# Patient Record
Sex: Female | Born: 1980 | Race: White | Hispanic: No | Marital: Married | Smoking: Former smoker
Health system: Southern US, Community
[De-identification: ages and names within clinical notes are randomized; demographics above are authoritative.]

## PROBLEM LIST (undated history)

## (undated) ENCOUNTER — Inpatient Hospital Stay (HOSPITAL_COMMUNITY): Payer: Self-pay

## (undated) DIAGNOSIS — I10 Essential (primary) hypertension: Secondary | ICD-10-CM

## (undated) DIAGNOSIS — B019 Varicella without complication: Secondary | ICD-10-CM

## (undated) DIAGNOSIS — N390 Urinary tract infection, site not specified: Secondary | ICD-10-CM

## (undated) DIAGNOSIS — F419 Anxiety disorder, unspecified: Secondary | ICD-10-CM

## (undated) HISTORY — DX: Varicella without complication: B01.9

## (undated) HISTORY — DX: Anxiety disorder, unspecified: F41.9

## (undated) HISTORY — DX: Urinary tract infection, site not specified: N39.0

## (undated) HISTORY — DX: Essential (primary) hypertension: I10

---

## 2012-01-10 ENCOUNTER — Encounter (HOSPITAL_COMMUNITY): Payer: Self-pay | Admitting: *Deleted

## 2012-01-10 ENCOUNTER — Emergency Department (HOSPITAL_COMMUNITY)
Admission: EM | Admit: 2012-01-10 | Discharge: 2012-01-10 | Disposition: A | Discharge status: Home / Self Care | Payer: Self-pay | Attending: Emergency Medicine | Admitting: Emergency Medicine

## 2012-01-10 DIAGNOSIS — M79609 Pain in unspecified limb: Secondary | ICD-10-CM | POA: Insufficient documentation

## 2012-01-10 DIAGNOSIS — S61409A Unspecified open wound of unspecified hand, initial encounter: Secondary | ICD-10-CM | POA: Insufficient documentation

## 2012-01-10 DIAGNOSIS — Y92009 Unspecified place in unspecified non-institutional (private) residence as the place of occurrence of the external cause: Secondary | ICD-10-CM | POA: Insufficient documentation

## 2012-01-10 DIAGNOSIS — IMO0002 Reserved for concepts with insufficient information to code with codable children: Secondary | ICD-10-CM

## 2012-01-10 DIAGNOSIS — W268XXA Contact with other sharp object(s), not elsewhere classified, initial encounter: Secondary | ICD-10-CM | POA: Insufficient documentation

## 2012-01-10 DIAGNOSIS — Y93E6 Activity, residential relocation: Secondary | ICD-10-CM | POA: Insufficient documentation

## 2012-01-10 MED ORDER — HYDROCODONE-ACETAMINOPHEN 5-325 MG PO TABS: 1.0000 | ORAL_TABLET | Freq: Four times a day (QID) | ORAL | Status: AC | PRN

## 2012-01-10 MED ORDER — TETANUS-DIPHTH-ACELL PERTUSSIS 5-2.5-18.5 LF-MCG/0.5 IM SUSP
0.5000 mL | Freq: Once | INTRAMUSCULAR | Status: AC
  Administered 2012-01-10: 0.5 mL via INTRAMUSCULAR
  Filled 2012-01-10: qty 0.5

## 2012-01-10 NOTE — ED Notes (Signed)
P.A. At bedside for suturing of laceration

## 2012-01-10 NOTE — ED Notes (Signed)
Pt denies any pain or questions upon discharge. Pt has no observed or reported reaction to tdap shot.

## 2012-01-10 NOTE — ED Provider Notes (Signed)
History     CSN: [id number]  Arrival date & time 01/10/12  2009   First MD Initiated Contact with Patient 01/10/12 2133      Chief Complaint  Patient presents with  . Laceration    (Consider location/radiation/quality/duration/timing/severity/associated sxs/prior treatment) HPI Comments: Patient presents to the ED with a right hand laceration.  Laceration occurred around 8 o'clock and 8 name.  Patient states that she was packing up her house to move and cut her hand on a vegetable chopper blade.  Patient denies lightheadedness or dizziness.  She has no other complaints.  Patient is a 31 y.o. female presenting with skin laceration. The history is provided by the patient.  Laceration  The incident occurred 1 to 2 hours ago. The laceration is located on the right hand. The laceration is 3 cm in size. The laceration mechanism was a a metal edge. The pain is at a severity of 3/10. The pain is mild. The pain has been constant since onset. She reports no foreign bodies present. Her tetanus status is out of date.    History reviewed. No pertinent past medical history.  History reviewed. No pertinent past surgical history.  History reviewed. No pertinent family history.  History  Substance Use Topics  . Smoking status: Former Games developer  . Smokeless tobacco: Not on file  . Alcohol Use: Yes    OB History    Grav Para Term Preterm Abortions TAB SAB Ect Mult Living                  Review of Systems  Constitutional: Negative for fever, chills and appetite change.  HENT: Negative for congestion.   Eyes: Negative for visual disturbance.  Respiratory: Negative for shortness of breath.   Cardiovascular: Negative for chest pain and leg swelling.  Gastrointestinal: Negative for abdominal pain.  Genitourinary: Negative for dysuria, urgency and frequency.  Neurological: Negative for dizziness, syncope, weakness, light-headedness, numbness and headaches.  Psychiatric/Behavioral: Negative for  confusion.  All other systems reviewed and are negative.    Allergies  Review of patient's allergies indicates no known allergies.  Home Medications   Current Outpatient Rx  Name Route Sig Dispense Refill  . IBUPROFEN 200 MG PO TABS Oral Take 400 mg by mouth every 6 (six) hours as needed. For pain      BP 115/79  Pulse 84  Temp(Src) 98 F (36.7 C) (Oral)  Resp 19  Ht 5\' 5"  (1.651 m)  Wt 140 lb (63.504 kg)  BMI 23.30 kg/m2  SpO2 98%  LMP 01/10/2012  Physical Exam  Nursing note and vitals reviewed. Constitutional: She is oriented to person, place, and time. She appears well-developed and well-nourished. No distress.  HENT:  Head: Normocephalic and atraumatic.  Eyes: Conjunctivae and EOM are normal.  Neck: Normal range of motion.  Pulmonary/Chest: Effort normal.  Musculoskeletal: Normal range of motion.  Neurological: She is alert and oriented to person, place, and time.  Skin: Skin is warm and dry. Laceration noted. No rash noted. She is not diaphoretic.       3 cm laceration located on  palmar surface of right hand below index finger.  Full range of motion of MCP PIP and DIPs.  2 point discrimination intact. No FB, warmth, or surrounding erythema.   Psychiatric: She has a normal mood and affect. Her behavior is normal.    ED Course  Procedures (including critical care time)  Labs Reviewed - No data to display No results found.  No diagnosis found.  LACERATION REPAIR Performed by: Jaci Carrel Authorized by: Jaci Carrel Consent: Verbal consent obtained. Risks and benefits: risks, benefits and alternatives were discussed Consent given by: patient Patient identity confirmed: provided demographic data Prepped and Draped in normal sterile fashion Wound explored  Laceration Location: right hand palmer surface below index finger  Laceration Length: 3cm  No Foreign Bodies seen or palpated  Anesthesia: local infiltration  Local anesthetic: lidocaine 1% with  out epinephrine  Anesthetic total: 1.5 ml  Irrigation method: syringe Amount of cleaning: standard  Skin closure: 4.0 prolene  Number of sutures: 6  Technique: simple interupted  Patient tolerance: Patient tolerated the procedure well with no immediate complications.   MDM  Laceration repair  Patient was given tetanus booster while in the emergency department.  She's been advised to followup for suture removal and wound recheck in 7 days.  I discussed with the patient home wound care treatment and I have told the patient I would provide this information in her discharge paperwork as well.    Medical screening examination/treatment/procedure(s) were performed by non-physician practitioner and as supervising physician I was immediately available for consultation/collaboration. Osvaldo Human, M.D.     Jaci Carrel, PA-C 01/10/12 2227  Carleene Cooper III, MD 01/11/12 [phone]

## 2012-01-10 NOTE — ED Notes (Signed)
Pt reports packing "the blade was sticking out of the blender and I was mashing hard and cut my hand". Pt has laceration to right hand, bleeding controlled at this time. Palpable radial pulse and pt can wiggle all digits. Pt has gauze over laceration with pressure added.

## 2012-01-10 NOTE — ED Notes (Signed)
The pt has hand laceration she did on a chopper when she pressed against it.  No active bleeding at present

## 2016-06-03 ENCOUNTER — Encounter (HOSPITAL_COMMUNITY): Payer: Self-pay | Admitting: *Deleted

## 2016-06-03 ENCOUNTER — Inpatient Hospital Stay (HOSPITAL_COMMUNITY)
Admission: AD | Admit: 2016-06-03 | Discharge: 2016-06-04 | Disposition: A | Discharge status: Other Acute Inpatient Hospital | Payer: Managed Care, Other (non HMO) | Attending: Emergency Medicine | Admitting: Emergency Medicine

## 2016-06-03 ENCOUNTER — Emergency Department (HOSPITAL_COMMUNITY): Payer: Managed Care, Other (non HMO)

## 2016-06-03 DIAGNOSIS — Z3A01 Less than 8 weeks gestation of pregnancy: Secondary | ICD-10-CM | POA: Insufficient documentation

## 2016-06-03 DIAGNOSIS — Z87891 Personal history of nicotine dependence: Secondary | ICD-10-CM | POA: Insufficient documentation

## 2016-06-03 DIAGNOSIS — O209 Hemorrhage in early pregnancy, unspecified: Secondary | ICD-10-CM | POA: Insufficient documentation

## 2016-06-03 DIAGNOSIS — R102 Pelvic and perineal pain: Secondary | ICD-10-CM

## 2016-06-03 DIAGNOSIS — R109 Unspecified abdominal pain: Secondary | ICD-10-CM | POA: Diagnosis not present

## 2016-06-03 DIAGNOSIS — O009 Unspecified ectopic pregnancy without intrauterine pregnancy: Secondary | ICD-10-CM

## 2016-06-03 DIAGNOSIS — O3680X0 Pregnancy with inconclusive fetal viability, not applicable or unspecified: Secondary | ICD-10-CM

## 2016-06-03 LAB — URINALYSIS, ROUTINE W REFLEX MICROSCOPIC
Bilirubin Urine: NEGATIVE
Glucose, UA: NEGATIVE mg/dL
Ketones, ur: 40 mg/dL — AB
LEUKOCYTES UA: NEGATIVE
Nitrite: NEGATIVE
PROTEIN: 30 mg/dL — AB
SPECIFIC GRAVITY, URINE: 1.025 (ref 1.005–1.030)
pH: 7 (ref 5.0–8.0)

## 2016-06-03 LAB — COMPREHENSIVE METABOLIC PANEL
ALK PHOS: 67 U/L (ref 38–126)
ALT: 12 U/L — ABNORMAL LOW (ref 14–54)
ANION GAP: 9 (ref 5–15)
AST: 16 U/L (ref 15–41)
Albumin: 4.4 g/dL (ref 3.5–5.0)
BILIRUBIN TOTAL: 0.6 mg/dL (ref 0.3–1.2)
BUN: 9 mg/dL (ref 6–20)
CALCIUM: 9 mg/dL (ref 8.9–10.3)
CO2: 23 mmol/L (ref 22–32)
Chloride: 104 mmol/L (ref 101–111)
Creatinine, Ser: 0.57 mg/dL (ref 0.44–1.00)
GLUCOSE: 100 mg/dL — AB (ref 65–99)
POTASSIUM: 3.7 mmol/L (ref 3.5–5.1)
Sodium: 136 mmol/L (ref 135–145)
Total Protein: 7.4 g/dL (ref 6.5–8.1)

## 2016-06-03 LAB — CBC
HCT: 37.1 % (ref 36.0–46.0)
HEMOGLOBIN: 13 g/dL (ref 12.0–15.0)
MCH: 31.7 pg (ref 26.0–34.0)
MCHC: 35 g/dL (ref 30.0–36.0)
MCV: 90.5 fL (ref 78.0–100.0)
Platelets: 256 10*3/uL (ref 150–400)
RBC: 4.1 MIL/uL (ref 3.87–5.11)
RDW: 13.4 % (ref 11.5–15.5)
WBC: 14 10*3/uL — AB (ref 4.0–10.5)

## 2016-06-03 LAB — ABO/RH: ABO/RH(D): A POS

## 2016-06-03 LAB — TYPE AND SCREEN
ABO/RH(D): A POS
ANTIBODY SCREEN: NEGATIVE

## 2016-06-03 LAB — I-STAT BETA HCG BLOOD, ED (MC, WL, AP ONLY): HCG, QUANTITATIVE: 380.7 m[IU]/mL — AB (ref <5)

## 2016-06-03 LAB — URINE MICROSCOPIC-ADD ON

## 2016-06-03 LAB — HCG, QUANTITATIVE, PREGNANCY: hCG, Beta Chain, Quant, S: 404 m[IU]/mL — ABNORMAL HIGH (ref <5)

## 2016-06-03 LAB — LIPASE, BLOOD: Lipase: 25 U/L (ref 11–51)

## 2016-06-03 MED ORDER — SODIUM CHLORIDE 0.9 % IV BOLUS (SEPSIS)
1000.0000 mL | Freq: Once | INTRAVENOUS | Status: AC
  Administered 2016-06-03: 1000 mL via INTRAVENOUS

## 2016-06-03 NOTE — Progress Notes (Signed)
Patient listed as having [employer] insurance without a pcp.  EDCM provided patient with a list of pcps who accept [employer] insurance within a 15 mile radius of patient's zip code.  Patient thankful for services.  No further EDCM needs at this time.

## 2016-06-03 NOTE — ED Notes (Signed)
US at bedside

## 2016-06-03 NOTE — ED Provider Notes (Signed)
CSN: [id number]     Arrival date & time 06/03/16  1706 History   First MD Initiated Contact with Patient 06/03/16 1929     Chief Complaint  Patient presents with  . Abdominal Pain     (Consider location/radiation/quality/duration/timing/severity/associated sxs/prior Treatment) Patient is a 35 y.o. female presenting with abdominal pain. The history is provided by the patient (Patient complains of right lower quadrant abdominal pain. She had some bleeding vaginally 4 days ago. Spotting for a month).  Abdominal Pain Pain location: Right lower quadrant. Pain quality: aching   Pain radiates to:  Does not radiate Pain severity:  Moderate Onset quality:  Sudden Timing:  Constant Progression:  Waxing and waning Chronicity:  New Context: not alcohol use   Associated symptoms: no chest pain, no cough, no diarrhea, no fatigue and no hematuria     History reviewed. No pertinent past medical history. History reviewed. No pertinent past surgical history. No family history on file. Social History  Substance Use Topics  . Smoking status: Former Games developer  . Smokeless tobacco: None  . Alcohol Use: Yes   OB History    No data available     Review of Systems  Constitutional: Negative for appetite change and fatigue.  HENT: Negative for congestion, ear discharge and sinus pressure.   Eyes: Negative for discharge.  Respiratory: Negative for cough.   Cardiovascular: Negative for chest pain.  Gastrointestinal: Positive for abdominal pain. Negative for diarrhea.  Genitourinary: Negative for frequency and hematuria.       Vaginal bleeding  Musculoskeletal: Negative for back pain.  Skin: Negative for rash.  Neurological: Negative for seizures and headaches.  Psychiatric/Behavioral: Negative for hallucinations.      Allergies  Review of patient's allergies indicates no known allergies.  Home Medications   Prior to Admission medications   Medication Sig Start Date End Date Taking?  Authorizing Provider  ibuprofen (ADVIL,MOTRIN) 200 MG tablet Take 400 mg by mouth every 6 (six) hours as needed. For pain    Historical Provider, MD   BP 132/84 mmHg  Pulse 88  Temp(Src) 98.5 F (36.9 C) (Oral)  Resp 20  SpO2 98%  LMP 06/03/2016 Physical Exam  Constitutional: She is oriented to person, place, and time. She appears well-developed.  HENT:  Head: Normocephalic.  Eyes: Conjunctivae and EOM are normal. No scleral icterus.  Neck: Neck supple. No thyromegaly present.  Cardiovascular: Normal rate and regular rhythm.  Exam reveals no gallop and no friction rub.   No murmur heard. Pulmonary/Chest: No stridor. She has no wheezes. She has no rales. She exhibits no tenderness.  Abdominal: She exhibits no distension. There is tenderness. There is no rebound.  Moderate right lower quadrant tenderness  Genitourinary:  Tender right adnexal area. Os closed small amount of blood in vault  Musculoskeletal: Normal range of motion. She exhibits no edema.  Lymphadenopathy:    She has no cervical adenopathy.  Neurological: She is oriented to person, place, and time. She exhibits normal muscle tone. Coordination normal.  Skin: No rash noted. No erythema.  Psychiatric: She has a normal mood and affect. Her behavior is normal.    ED Course  Procedures (including critical care time) Labs Review Labs Reviewed  COMPREHENSIVE METABOLIC PANEL - Abnormal; Notable for the following:    Glucose, Bld 100 (*)    ALT 12 (*)    All other components within normal limits  CBC - Abnormal; Notable for the following:    WBC 14.0 (*)  All other components within normal limits  URINALYSIS, ROUTINE W REFLEX MICROSCOPIC (NOT AT [facility]) - Abnormal; Notable for the following:    APPearance CLOUDY (*)    Hgb urine dipstick LARGE (*)    Ketones, ur 40 (*)    Protein, ur 30 (*)    All other components within normal limits  URINE MICROSCOPIC-ADD ON - Abnormal; Notable for the following:    Squamous  Epithelial / LPF 6-30 (*)    Bacteria, UA MANY (*)    All other components within normal limits  HCG, QUANTITATIVE, PREGNANCY - Abnormal; Notable for the following:    hCG, Beta Chain, Quant, S 404 (*)    All other components within normal limits  I-STAT BETA HCG BLOOD, ED ([facility], [facility], [facility] ONLY) - Abnormal; Notable for the following:    I-stat hCG, quantitative 380.7 (*)    All other components within normal limits  LIPASE, BLOOD  TYPE AND SCREEN  ABO/RH  GC/CHLAMYDIA PROBE AMP ([place]) NOT AT [facility]    Imaging Review US Ob Comp Less 14 Wks  06/03/2016  CLINICAL DATA:  Right lower quadrant pelvic pain. Vaginal bleeding. Gestational age of [redacted] weeks 6 days by last menstrual. Beta HCG of 404. EXAM: OBSTETRIC <14 WK [place] AND TRANSVAGINAL OB US TECHNIQUE: Both transabdominal and transvaginal ultrasound examinations were performed for complete evaluation of the gestation as well as the maternal uterus, adnexal regions, and pelvic cul-de-sac. Transvaginal technique was performed to assess early pregnancy. COMPARISON:  None. FINDINGS: Intrauterine gestational sac: Absent Yolk sac:  Absent Embryo:  Absent Endometrium is thickened at 16 mm. Maternal uterus/adnexae: The left ovary measures 2.6 x 2.0 x 2.7 cm. Normal in morphology. Soft tissue echogenicity in the right adnexa of 4.8 x 2.0 x 4.2 cm is favored to represent ovarian parenchyma. Within the right ovary is a 1.7 x 1.4 x 2.1 cm simple appearing cyst. No definite extra ovarian adnexal mass is seen. There is a moderate amount of complex flu identified within the cul-de-sac, including on image 47. Fluid is also identified adjacent the left ovary. IMPRESSION: Lack of intrauterine gestational sac. Endometrial thickening with a moderate amount of complex fluid in the cul-de-sac. Although findings could relate to early intrauterine pregnancy (given the low beta HCG level), complex fluid is suspicious and differential considerations include ectopic pregnancy or  missed abortion. Consider short term beta HCG follow-up and possibly ultrasound. Electronically Signed   By: Jeronimo Greaves M.D.   On: 06/03/2016 21:38   US Ob Transvaginal  06/03/2016  CLINICAL DATA:  Right lower quadrant pelvic pain. Vaginal bleeding. Gestational age of [redacted] weeks 6 days by last menstrual. Beta HCG of 404. EXAM: OBSTETRIC <14 WK [place] AND TRANSVAGINAL OB US TECHNIQUE: Both transabdominal and transvaginal ultrasound examinations were performed for complete evaluation of the gestation as well as the maternal uterus, adnexal regions, and pelvic cul-de-sac. Transvaginal technique was performed to assess early pregnancy. COMPARISON:  None. FINDINGS: Intrauterine gestational sac: Absent Yolk sac:  Absent Embryo:  Absent Endometrium is thickened at 16 mm. Maternal uterus/adnexae: The left ovary measures 2.6 x 2.0 x 2.7 cm. Normal in morphology. Soft tissue echogenicity in the right adnexa of 4.8 x 2.0 x 4.2 cm is favored to represent ovarian parenchyma. Within the right ovary is a 1.7 x 1.4 x 2.1 cm simple appearing cyst. No definite extra ovarian adnexal mass is seen. There is a moderate amount of complex flu identified within the cul-de-sac, including on image 47. Fluid is also  identified adjacent the left ovary. IMPRESSION: Lack of intrauterine gestational sac. Endometrial thickening with a moderate amount of complex fluid in the cul-de-sac. Although findings could relate to early intrauterine pregnancy (given the low beta HCG level), complex fluid is suspicious and differential considerations include ectopic pregnancy or missed abortion. Consider short term beta HCG follow-up and possibly ultrasound. Electronically Signed   By: Jeronimo GreavesKyle  Talbot M.D.   On: 06/03/2016 21:38   I have personally reviewed and evaluated these images and lab results as part of my medical decision-making.   EKG Interpretation None      MDM   Final diagnoses:  Abdominal pain in female    Ultrasound of pelvis cannot rule  out ectopic pregnancy. I spoke with the GYN doctor Dr. Debroah LoopArnold and he [facility]. Patient will be transferred over there now    Bethann BerkshireJoseph Jeyla Bulger, MD 06/03/16 2210

## 2016-06-03 NOTE — MAU Note (Signed)
[facility] Pt rates pain at 6/10.

## 2016-06-03 NOTE — MAU Provider Note (Signed)
History     CSN: [id number]  Arrival date & time 06/03/16  1706   First Provider Initiated Contact with Patient 06/03/16 2351      Chief Complaint  Patient presents with  . Abdominal Pain  RLQ pain sudden onset  HPI Patient is a 35 y.o. female G1P0, Patient's last menstrual period was 05/11/2016.  presenting with abdominal pain. The history is provided by the patient (Patient complains of right lower quadrant abdominal pain. She had some bleeding vaginally 4 days ago. Spotting since onset of LMP, heavier bleeding today. She was transferred from [facility] ED for evaluation after positive pregnancy test and [place] that did not show IUP Abdominal Pain Pain location: Right lower quadrant. Pain quality: aching  Pain radiates to: Does not radiate Pain severity: Moderate Onset quality: Sudden Timing: Constant Progression: Waxing and waning Chronicity: New Context: not alcohol use  Associated symptoms: no chest pain, no cough, no diarrhea, no fatigue and no hematuria    Past Medical History  Diagnosis Date  . Medical history non-contributory     Past Surgical History  Procedure Laterality Date  . No past surgeries      History reviewed. No pertinent family history.  Social History  Substance Use Topics  . Smoking status: Former Games developer  . Smokeless tobacco: None  . Alcohol Use: Yes     Comment: 1-2 glasses a wine a month    OB History    Gravida Para Term Preterm AB TAB SAB Ectopic Multiple Living   1               Review of Systems  Constitutional: Negative.   Gastrointestinal: Positive for abdominal pain (RLQ). Negative for nausea.  Genitourinary: Positive for vaginal bleeding and menstrual problem. Negative for frequency, vaginal discharge and difficulty urinating.    Allergies  Review of patient's allergies indicates no known allergies.  Home Medications  No current outpatient prescriptions on file.  BP 141/75 mmHg  Pulse 78  Temp(Src) 98.6 F (37 C)  (Oral)  Resp 18  SpO2 100%  LMP 05/11/2016  Physical Exam  Constitutional: She is oriented to person, place, and time. She appears well-developed. No distress.  Pulmonary/Chest: Effort normal. No respiratory distress.  Genitourinary: Uterus normal. Vaginal discharge (vaginal bleeding) found.  Very minimal tenderness no mass no guarding  Neurological: She is alert and oriented to person, place, and time.  Skin: Skin is warm and dry.  Psychiatric: She has a normal mood and affect. Her behavior is normal.    MAU Course  Procedures (including critical care time)  Labs Reviewed  COMPREHENSIVE METABOLIC PANEL - Abnormal; Notable for the following:    Glucose, Bld 100 (*)    ALT 12 (*)    All other components within normal limits  CBC - Abnormal; Notable for the following:    WBC 14.0 (*)    All other components within normal limits  URINALYSIS, ROUTINE W REFLEX MICROSCOPIC (NOT AT [facility]) - Abnormal; Notable for the following:    APPearance CLOUDY (*)    Hgb urine dipstick LARGE (*)    Ketones, ur 40 (*)    Protein, ur 30 (*)    All other components within normal limits  URINE MICROSCOPIC-ADD ON - Abnormal; Notable for the following:    Squamous Epithelial / LPF 6-30 (*)    Bacteria, UA MANY (*)    All other components within normal limits  HCG, QUANTITATIVE, PREGNANCY - Abnormal; Notable for the following:    hCG,  Beta Chain, Quant, S 404 (*)    All other components within normal limits  I-STAT BETA HCG BLOOD, ED ([facility], [facility], [facility] ONLY) - Abnormal; Notable for the following:    I-stat hCG, quantitative 380.7 (*)    All other components within normal limits  LIPASE, BLOOD  TYPE AND SCREEN  ABO/RH  GC/CHLAMYDIA PROBE AMP ([place]) NOT AT [facility]   US Ob Comp Less 14 Wks  06/03/2016  CLINICAL DATA:  Right lower quadrant pelvic pain. Vaginal bleeding. Gestational age of [redacted] weeks 6 days by last menstrual. Beta HCG of 404. EXAM: OBSTETRIC <14 WK [place] AND TRANSVAGINAL OB US TECHNIQUE: Both  transabdominal and transvaginal ultrasound examinations were performed for complete evaluation of the gestation as well as the maternal uterus, adnexal regions, and pelvic cul-de-sac. Transvaginal technique was performed to assess early pregnancy. COMPARISON:  None. FINDINGS: Intrauterine gestational sac: Absent Yolk sac:  Absent Embryo:  Absent Endometrium is thickened at 16 mm. Maternal uterus/adnexae: The left ovary measures 2.6 x 2.0 x 2.7 cm. Normal in morphology. Soft tissue echogenicity in the right adnexa of 4.8 x 2.0 x 4.2 cm is favored to represent ovarian parenchyma. Within the right ovary is a 1.7 x 1.4 x 2.1 cm simple appearing cyst. No definite extra ovarian adnexal mass is seen. There is a moderate amount of complex flu identified within the cul-de-sac, including on image 47. Fluid is also identified adjacent the left ovary. IMPRESSION: Lack of intrauterine gestational sac. Endometrial thickening with a moderate amount of complex fluid in the cul-de-sac. Although findings could relate to early intrauterine pregnancy (given the low beta HCG level), complex fluid is suspicious and differential considerations include ectopic pregnancy or missed abortion. Consider short term beta HCG follow-up and possibly ultrasound. Electronically Signed   By: Jeronimo Greaves M.D.   On: 06/03/2016 21:38   US Ob Transvaginal  06/03/2016  CLINICAL DATA:  Right lower quadrant pelvic pain. Vaginal bleeding. Gestational age of [redacted] weeks 6 days by last menstrual. Beta HCG of 404. EXAM: OBSTETRIC <14 WK [place] AND TRANSVAGINAL OB US TECHNIQUE: Both transabdominal and transvaginal ultrasound examinations were performed for complete evaluation of the gestation as well as the maternal uterus, adnexal regions, and pelvic cul-de-sac. Transvaginal technique was performed to assess early pregnancy. COMPARISON:  None. FINDINGS: Intrauterine gestational sac: Absent Yolk sac:  Absent Embryo:  Absent Endometrium is thickened at 16 mm. Maternal  uterus/adnexae: The left ovary measures 2.6 x 2.0 x 2.7 cm. Normal in morphology. Soft tissue echogenicity in the right adnexa of 4.8 x 2.0 x 4.2 cm is favored to represent ovarian parenchyma. Within the right ovary is a 1.7 x 1.4 x 2.1 cm simple appearing cyst. No definite extra ovarian adnexal mass is seen. There is a moderate amount of complex flu identified within the cul-de-sac, including on image 47. Fluid is also identified adjacent the left ovary. IMPRESSION: Lack of intrauterine gestational sac. Endometrial thickening with a moderate amount of complex fluid in the cul-de-sac. Although findings could relate to early intrauterine pregnancy (given the low beta HCG level), complex fluid is suspicious and differential considerations include ectopic pregnancy or missed abortion. Consider short term beta HCG follow-up and possibly ultrasound. Electronically Signed   By: Jeronimo Greaves M.D.   On: 06/03/2016 21:38         MDM  I reviewed the images and the [place] report, ED note and labs  Patient Active Problem List   Diagnosis Date Noted  . Pregnancy of unknown  anatomic location 06/03/2016   Possible ectopic pregnancy vs. Early pregnancy with ruptured cyst. Exam is benign with little tenderness no guarding. This is a desired pregnancy. I discussed the possibility of ectopic pregnancy and that surgical management is sometimes necessary. As she has a benign exam and is very stable with improvement in her pain she can have expectant management with close follow up. Return to MAU in 48 hours, 6/11/, for repeat HCG Ectopic precautions given  Adam PhenixJames G Kimla Furth, MD 06/04/2016

## 2016-06-03 NOTE — ED Notes (Addendum)
[facility] called to transport pt to MAU. [facility] advised that there would be a 2 hr wait. MD aware. Dr. Estell HarpinZammit requested to call EMS to transfer pt.

## 2016-06-03 NOTE — ED Notes (Signed)
Pt c/o lower right quadrant abdominal pain. States she had her period about 3 weeks ago with spotting and returned 4 days ago. Reports having big blood clots and extremely severe cramps, unlike her normal menses.

## 2016-06-04 MED ORDER — ACETAMINOPHEN 500 MG PO TABS
1000.0000 mg | ORAL_TABLET | Freq: Once | ORAL | Status: AC
  Administered 2016-06-04: 1000 mg via ORAL
  Filled 2016-06-04: qty 2

## 2016-06-04 NOTE — Discharge Instructions (Signed)

## 2016-06-05 ENCOUNTER — Inpatient Hospital Stay (HOSPITAL_COMMUNITY)
Admission: AD | Admit: 2016-06-05 | Discharge: 2016-06-05 | Disposition: A | Discharge status: Home / Self Care | Payer: Managed Care, Other (non HMO) | Source: Ambulatory Visit | Attending: Obstetrics and Gynecology | Admitting: Obstetrics and Gynecology

## 2016-06-05 ENCOUNTER — Inpatient Hospital Stay (HOSPITAL_COMMUNITY): Payer: Managed Care, Other (non HMO)

## 2016-06-05 DIAGNOSIS — O009 Unspecified ectopic pregnancy without intrauterine pregnancy: Secondary | ICD-10-CM | POA: Diagnosis not present

## 2016-06-05 DIAGNOSIS — O001 Tubal pregnancy without intrauterine pregnancy: Secondary | ICD-10-CM | POA: Diagnosis not present

## 2016-06-05 DIAGNOSIS — Z87891 Personal history of nicotine dependence: Secondary | ICD-10-CM | POA: Diagnosis not present

## 2016-06-05 DIAGNOSIS — R1031 Right lower quadrant pain: Secondary | ICD-10-CM | POA: Diagnosis present

## 2016-06-05 LAB — COMPREHENSIVE METABOLIC PANEL
ALT: 11 U/L — ABNORMAL LOW (ref 14–54)
AST: 16 U/L (ref 15–41)
Albumin: 4.1 g/dL (ref 3.5–5.0)
Alkaline Phosphatase: 71 U/L (ref 38–126)
Anion gap: 6 (ref 5–15)
BUN: 14 mg/dL (ref 6–20)
CO2: 27 mmol/L (ref 22–32)
Calcium: 9.2 mg/dL (ref 8.9–10.3)
Chloride: 103 mmol/L (ref 101–111)
Creatinine, Ser: 0.65 mg/dL (ref 0.44–1.00)
GFR calc Af Amer: >60 mL/min (ref >60)
GFR calc non Af Amer: >60 mL/min (ref >60)
Glucose, Bld: 98 mg/dL (ref 65–99)
Potassium: 4 mmol/L (ref 3.5–5.1)
Sodium: 136 mmol/L (ref 135–145)
Total Bilirubin: 0.5 mg/dL (ref 0.3–1.2)
Total Protein: 7.2 g/dL (ref 6.5–8.1)

## 2016-06-05 LAB — CBC
HCT: 39.9 % (ref 36.0–46.0)
Hemoglobin: 13.4 g/dL (ref 12.0–15.0)
MCH: 30.4 pg (ref 26.0–34.0)
MCHC: 33.6 g/dL (ref 30.0–36.0)
MCV: 90.5 fL (ref 78.0–100.0)
Platelets: 245 10*3/uL (ref 150–400)
RBC: 4.41 MIL/uL (ref 3.87–5.11)
RDW: 13.3 % (ref 11.5–15.5)
WBC: 7.4 10*3/uL (ref 4.0–10.5)

## 2016-06-05 LAB — HCG, QUANTITATIVE, PREGNANCY: HCG, BETA CHAIN, QUANT, S: 568 m[IU]/mL — AB (ref <5)

## 2016-06-05 MED ORDER — OXYCODONE-ACETAMINOPHEN 5-325 MG PO TABS: 1.0000 | ORAL_TABLET | Freq: Four times a day (QID) | ORAL | Status: DC | PRN

## 2016-06-05 MED ORDER — METHOTREXATE INJECTION FOR WOMEN'S HOSPITAL
50.0000 mg/m2 | Freq: Once | INTRAMUSCULAR | Status: AC
  Administered 2016-06-05: 90 mg via INTRAMUSCULAR
  Filled 2016-06-05: qty 1.8

## 2016-06-05 NOTE — MAU Note (Signed)
Patient here for repeat BHCG, having abdominal pain which has been since Friday, still having pain not as sharp 6-7/10, vaginal bleeding can use the same pad all day.

## 2016-06-05 NOTE — MAU Note (Signed)
Called lab to inquire about results, was told specimen was not received so had not been ran, explained to patient would be another hour before results received, verbalized an understanding and agreed to wait for results.

## 2016-06-05 NOTE — Discharge Instructions (Signed)
Ectopic Pregnancy °An ectopic pregnancy is when the fertilized egg attaches (implants) outside the uterus. Most ectopic pregnancies occur in the fallopian tube. Rarely do ectopic pregnancies occur on the ovary, intestine, pelvis, or cervix. In an ectopic pregnancy, the fertilized egg does not have the ability to develop into a normal, healthy baby.  °A ruptured ectopic pregnancy is one in which the fallopian tube gets torn or bursts and results in internal bleeding. Often there is intense abdominal pain, and sometimes, vaginal bleeding. Having an ectopic pregnancy can be life threatening. If left untreated, this dangerous condition can lead to a blood transfusion, abdominal surgery, or even death. °CAUSES  °Damage to the fallopian tubes is the suspected cause in most ectopic pregnancies.  °RISK FACTORS °Depending on your circumstances, the risk of having an ectopic pregnancy will vary. The level of risk can be divided into three categories. °High Risk °· You have gone through infertility treatment. °· You have had a previous ectopic pregnancy. °· You have had previous tubal surgery. °· You have had previous surgery to have the fallopian tubes tied (tubal ligation). °· You have tubal problems or diseases. °· You have been exposed to DES. DES is a medicine that was used until 1971 and had effects on babies whose mothers took the medicine. °· You become pregnant while using an intrauterine device (IUD) for birth control.  °Moderate Risk °· You have a history of infertility. °· You have a history of a sexually transmitted infection (STI). °· You have a history of pelvic inflammatory disease (PID). °· You have scarring from endometriosis. °· You have multiple sexual partners. °· You smoke.  °Low Risk °· You have had previous pelvic surgery. °· You use vaginal douching. °· You became sexually active before 34 years of age. °SIGNS AND SYMPTOMS  °An ectopic pregnancy should be suspected in anyone who has missed a period and  has abdominal pain or bleeding. °· You may experience normal pregnancy symptoms, such as: °· Nausea. °· Tiredness. °· Breast tenderness. °· Other symptoms may include: °· Pain with intercourse. °· Irregular vaginal bleeding or spotting. °· Cramping or pain on one side or in the lower abdomen. °· Fast heartbeat. °· Passing out while having a bowel movement. °· Symptoms of a ruptured ectopic pregnancy and internal bleeding may include: °· Sudden, severe pain in the abdomen and pelvis. °· Dizziness or fainting. °· Pain in the shoulder area. °DIAGNOSIS  °Tests that may be performed include: °· A pregnancy test. °· An ultrasound test. °· Testing the specific level of pregnancy hormone in the bloodstream. °· Taking a sample of uterus tissue (dilation and curettage, D&C). °· Surgery to perform a visual exam of the inside of the abdomen using a thin, lighted tube with a tiny camera on the end (laparoscope). °TREATMENT  °An injection of a medicine called methotrexate may be given. This medicine causes the pregnancy tissue to be absorbed. It is given if: °· The diagnosis is made early. °· The fallopian tube has not ruptured. °· You are considered to be a good candidate for the medicine. °Usually, pregnancy hormone blood levels are checked after methotrexate treatment. This is to be sure the medicine is effective. It may take 4-6 weeks for the pregnancy to be absorbed (though most pregnancies will be absorbed by 3 weeks). °Surgical treatment may be needed. A laparoscope may be used to remove the pregnancy tissue. If severe internal bleeding occurs, a cut (incision) may be made in the lower abdomen (laparotomy), and the ectopic   pregnancy is removed. This stops the bleeding. Part of the fallopian tube, or the whole tube, may be removed as well (salpingectomy). After surgery, pregnancy hormone tests may be done to be sure there is no pregnancy tissue left. You may receive a Rho (D) immune globulin shot if you are Rh negative and  the father is Rh positive, or if you do not know the Rh type of the father. This is to prevent problems with any future pregnancy. °SEEK IMMEDIATE MEDICAL CARE IF:  °You have any symptoms of an ectopic pregnancy. This is a medical emergency. °MAKE SURE YOU: °· Understand these instructions. °· Will watch your condition. °· Will get help right away if you are not doing well or get worse. °  °This information is not intended to replace advice given to you by your health care provider. Make sure you discuss any questions you have with your health care provider. °  °Document Released: 01/19/2005 Document Revised: 01/02/2015 Document Reviewed: 07/11/2013 °Elsevier Interactive Patient Education ©2016 Elsevier Inc. ° °Methotrexate Treatment for an Ectopic Pregnancy, Care After °Refer to this sheet in the next few weeks. These instructions provide you with information on caring for yourself after your procedure. Your health care provider may also give you more specific instructions. Your treatment has been planned according to current medical practices, but problems sometimes occur. Call your health care provider if you have any problems or questions after your procedure. °WHAT TO EXPECT AFTER THE PROCEDURE °You may have some abdominal cramping, vaginal bleeding, and fatigue in the first few days after taking methotrexate. Some other possible side effects of methotrexate include: °· Nausea. °· Vomiting. °· Diarrhea. °· Mouth sores. °· Swelling or irritation of the lining of your lungs (pneumonitis). °· Liver damage. °· Hair loss. °HOME CARE INSTRUCTIONS  °After you have received the methotrexate medicine, you need to be careful of your activities and watch your condition for several weeks. It may take 1 week before your hormone levels return to normal. °· Keep all follow-up appointments as directed by your health care provider. °· Avoid traveling too far away from your health care provider. °· Do not have sexual intercourse  until your health care provider says it is safe to do so. °· You may resume your usual diet. °· Limit strenuous activity. °· Do not take folic acid, prenatal vitamins, or other vitamins that contain folic acid. °· Do not take aspirin, ibuprofen, or naproxen (nonsteroidal anti-inflammatory drugs [NSAIDs]). °· Do not drink alcohol. °SEEK MEDICAL CARE IF:  °· You cannot control your nausea and vomiting. °· You cannot control your diarrhea. °· You have sores in your mouth and want treatment. °· You need pain medicine for your abdominal pain. °· You have a rash. °· You are having a reaction to the medicine. °SEEK IMMEDIATE MEDICAL CARE IF:  °· You have increasing abdominal or pelvic pain. °· You notice increased bleeding. °· You feel light-headed, or you faint. °· You have shortness of breath. °· Your heart rate increases. °· You have a cough. °· You have chills. °· You have a fever. °  °This information is not intended to replace advice given to you by your health care provider. Make sure you discuss any questions you have with your health care provider. °  °Document Released: 12/01/2011 Document Revised: 12/17/2013 Document Reviewed: 09/30/2013 °Elsevier Interactive Patient Education ©2016 Elsevier Inc. ° °

## 2016-06-05 NOTE — MAU Provider Note (Signed)
History     CSN: [id number]  Arrival date and time: 06/05/16 1622   None     Chief Complaint  Patient presents with  . Follow-up  . Abdominal Pain   HPI Janice Bradley is a 35 y.o. G1P0. She presents for repeat BHCG. LMP 5/17 making her 3 4/[redacted] wks EGA. She went to [facility] ED 6//9 with c/p RLQ pain and spotting x 23m bleed 4 d previous. Her BHCG was 404 U/S saw no IUGS, thick endometrium. There was soft tissue echogenicity on the right 4.8 x 2.0 x4.2 cm and 2 cm simple cyst. There was mod amt complex fluid in the cul de sac and left adnexa. Today she still has RLQ pain, thought it was getting better but increased past 1 hr. She rates it 7-8 on scale.   OB History    Gravida Para Term Preterm AB TAB SAB Ectopic Multiple Living   1               Past Medical History  Diagnosis Date  . Medical history non-contributory     Past Surgical History  Procedure Laterality Date  . No past surgeries      No family history on file.  Social History  Substance Use Topics  . Smoking status: Former SResearch scientist (life sciences) . Smokeless tobacco: Not on file  . Alcohol Use: Yes     Comment: 1-2 glasses a wine a month    Allergies: No Known Allergies  No prescriptions prior to admission    Review of Systems  Constitutional: Negative for diaphoresis.  Gastrointestinal: Positive for abdominal pain.  Neurological: Negative for dizziness and weakness.   Physical Exam   Blood pressure 133/66, pulse 89, temperature 98.1 F (36.7 C), resp. rate 18, height '5\' 6"'  (1.676 m), weight 160 lb (72.576 kg), last menstrual period 05/11/2016.  Physical Exam  Nursing note and vitals reviewed. Constitutional: She is oriented to person, place, and time. She appears well-developed and well-nourished.  GI: Soft. There is tenderness (RLQ tender). There is rebound. There is no guarding.  Musculoskeletal: Normal range of motion.  Neurological: She is alert and oriented to person, place, and time.  Skin: Skin is warm and  dry.  Psychiatric: She has a normal mood and affect. Her behavior is normal.    MAU Course  Procedures  MDM 8:15 pm. Radiology called with U/S results- probable right ectopic pregnancy. Consulted with Dr CElly Modena  Will give MTX, repeat BHCG Wed day 4 and Sat Day 7. Plan of care discussed with the pt. [place] Sunday [place] CBC and C Met pending  9:00 pm- CBC nl, C Met pending  Care to VMarlou Porch CNM  Results for orders placed or performed during the hospital encounter of 06/05/16 (from the past 24 hour(s))  hCG, quantitative, pregnancy     Status: Abnormal   Collection Time: 06/05/16  4:45 PM  Result Value Ref Range   hCG, Beta Chain, Quant, S 568 (H) <5 mIU/mL  CBC     Status: None   Collection Time: 06/05/16  8:39 PM  Result Value Ref Range   WBC 7.4 4.0 - 10.5 K/uL   RBC 4.41 3.87 - 5.11 MIL/uL   Hemoglobin 13.4 12.0 - 15.0 g/dL   HCT 39.9 36.0 - 46.0 %   MCV 90.5 78.0 - 100.0 fL   MCH 30.4 26.0 -  34.0 pg   MCHC 33.6 30.0 - 36.0 g/dL   RDW 13.3 11.5 - 15.5 %   Platelets 245 150 - 400 K/uL  Comprehensive metabolic panel     Status: Abnormal   Collection Time: 06/05/16  8:39 PM  Result Value Ref Range   Sodium 136 135 - 145 mmol/L   Potassium 4.0 3.5 - 5.1 mmol/L   Chloride 103 101 - 111 mmol/L   CO2 27 22 - 32 mmol/L   Glucose, Bld 98 65 - 99 mg/dL   BUN 14 6 - 20 mg/dL   Creatinine, Ser 0.65 0.44 - 1.00 mg/dL   Calcium 9.2 8.9 - 10.3 mg/dL   Total Protein 7.2 6.5 - 8.1 g/dL   Albumin 4.1 3.5 - 5.0 g/dL   AST 16 15 - 41 U/L   ALT 11 (L) 14 - 54 U/L   Alkaline Phosphatase 71 38 - 126 U/L   Total Bilirubin 0.5 0.3 - 1.2 mg/dL   GFR calc non Af Amer >60 >60 mL/min   GFR calc Af Amer >60 >60 mL/min   Anion gap 6 5 - 15   MTX given.   Assessment and Plan   1. Ectopic pregnancy    Discharge home in stable condition. Ectopic precautions. Support  given. Follow-up Information    Follow up with [facility] On 06/08/2016.   Specialty:  Obstetrics and Gynecology   Why:  at 11:00 for repeat blood work   Contact information:   [place] [phone]      Follow up with [facility].   Why:  As needed in emergencies   Contact information:   [address] [id number] [place] [phone]        Medication List    TAKE these medications        oxyCODONE-acetaminophen 5-325 MG tablet  Commonly known as:  PERCOCET/ROXICET  Take 1-2 tablets by mouth every 6 (six) hours as needed for severe pain.        Manya Silvas 06/05/2016, 10:28 PM

## 2016-06-08 ENCOUNTER — Telehealth: Payer: Self-pay | Admitting: General Practice

## 2016-06-08 ENCOUNTER — Encounter: Payer: Self-pay | Admitting: General Practice

## 2016-06-08 ENCOUNTER — Inpatient Hospital Stay (HOSPITAL_COMMUNITY)
Admission: AD | Admit: 2016-06-08 | Discharge: 2016-06-08 | Disposition: A | Discharge status: Home / Self Care | Payer: Managed Care, Other (non HMO) | Source: Ambulatory Visit | Attending: Obstetrics & Gynecology | Admitting: Obstetrics & Gynecology

## 2016-06-08 ENCOUNTER — Other Ambulatory Visit: Payer: Managed Care, Other (non HMO)

## 2016-06-08 DIAGNOSIS — O009 Unspecified ectopic pregnancy without intrauterine pregnancy: Secondary | ICD-10-CM

## 2016-06-08 DIAGNOSIS — O001 Tubal pregnancy without intrauterine pregnancy: Secondary | ICD-10-CM | POA: Diagnosis not present

## 2016-06-08 LAB — HCG, QUANTITATIVE, PREGNANCY: hCG, Beta Chain, Quant, S: 453 m[IU]/mL — ABNORMAL HIGH (ref <5)

## 2016-06-08 NOTE — Telephone Encounter (Signed)
Patient no showed for stat bhcg. Called patient, no answer- left message stating we are trying to reach you because you missed your schedule appt in our office this morning. Please call us back so we can reschedule this. You may come in tomorrow morning for this. Will send letter

## 2016-06-08 NOTE — MAU Note (Signed)
Pt here for repeat hcg. Had MTX on Sunday. States that vag bleeding is less. States that she is having some lower abdominal cramping-feels more than it was on Sunday. Rates 8/10.

## 2016-06-08 NOTE — Discharge Instructions (Signed)
Methotrexate Treatment for an Ectopic Pregnancy, Care After °Refer to this sheet in the next few weeks. These instructions provide you with information on caring for yourself after your procedure. Your health care provider may also give you more specific instructions. Your treatment has been planned according to current medical practices, but problems sometimes occur. Call your health care provider if you have any problems or questions after your procedure. °WHAT TO EXPECT AFTER THE PROCEDURE °You may have some abdominal cramping, vaginal bleeding, and fatigue in the first few days after taking methotrexate. Some other possible side effects of methotrexate include: °· Nausea. °· Vomiting. °· Diarrhea. °· Mouth sores. °· Swelling or irritation of the lining of your lungs (pneumonitis). °· Liver damage. °· Hair loss. °HOME CARE INSTRUCTIONS  °After you have received the methotrexate medicine, you need to be careful of your activities and watch your condition for several weeks. It may take 1 week before your hormone levels return to normal. °· Keep all follow-up appointments as directed by your health care provider. °· Avoid traveling too far away from your health care provider. °· Do not have sexual intercourse until your health care provider says it is safe to do so. °· You may resume your usual diet. °· Limit strenuous activity. °· Do not take folic acid, prenatal vitamins, or other vitamins that contain folic acid. °· Do not take aspirin, ibuprofen, or naproxen (nonsteroidal anti-inflammatory drugs [NSAIDs]). °· Do not drink alcohol. °SEEK MEDICAL CARE IF:  °· You cannot control your nausea and vomiting. °· You cannot control your diarrhea. °· You have sores in your mouth and want treatment. °· You need pain medicine for your abdominal pain. °· You have a rash. °· You are having a reaction to the medicine. °SEEK IMMEDIATE MEDICAL CARE IF:  °· You have increasing abdominal or pelvic pain. °· You notice increased  bleeding. °· You feel light-headed, or you faint. °· You have shortness of breath. °· Your heart rate increases. °· You have a cough. °· You have chills. °· You have a fever. °  °This information is not intended to replace advice given to you by your health care provider. Make sure you discuss any questions you have with your health care provider. °  °Document Released: 12/01/2011 Document Revised: 12/17/2013 Document Reviewed: 09/30/2013 °Elsevier Interactive Patient Education ©2016 Elsevier Inc. ° °

## 2016-06-08 NOTE — MAU Provider Note (Signed)
[name]  is a 35 y.o. G1P0 at Unknown who presents to MAU today for follow-up quant hCG on day #4 S/P MTX for ectopic pregnancy. [facility] but missed her appointment. She states decreased bleeding, only scant today. She had noted an increase in lower abdominal pain this morning, but that has improved. She rates pain now at 6/10. She has taken Tylenol and Percocet for pain.   BP 113/70 mmHg  Pulse 93  Temp(Src) 98.3 F (36.8 C) (Oral)  Resp 16  SpO2 100%  LMP 05/11/2016  CONSTITUTIONAL: Well-developed, well-nourished female in no acute distress.  ENT: External right and left ear normal.  EYES: EOM intact, conjunctivae normal.  MUSCULOSKELETAL: Normal range of motion.  CARDIOVASCULAR: Regular heart rate RESPIRATORY: Normal effort NEUROLOGICAL: Alert and oriented to person, place, and time.  ABDOMEN: soft, non-tender SKIN: Skin is warm and dry. No rash noted. Not diaphoretic. No erythema. No pallor. PSYCH: Normal mood and affect. Normal behavior. Normal judgment and thought content.  Results for Janice Bradley, Janice Bradley (MRN [id number]) as of 06/08/2016 20:42  Ref. Range 06/05/2016 16:45 06/05/2016 20:03 06/05/2016 20:39 06/08/2016 19:34  HCG, Beta Chain, Quant, S Latest Ref Range: <5 mIU/mL 568 (H)   453 (H)   MDM Discussed patient with Dr. Despina HiddenEure due to increased pain today, since hCG has shown decline and pain is managed now patient may be discharged to follow-up on Saturday Patient states that she is scheduled to be out of town this weekend for [place] brother[place]. I have advised that if she absolutely cannot be seen on Saturday here she could come Friday night prior to leaving town rather than not at all.   A: Ectopic pregnancy s/p MTX Decline in quant hCG on day #4  P: Discharge home Ectopic precautions discussed Patient advised to follow-up in MAU on Saturday for day #7 labs or sooner if her condition were to change or worsen    Marny LowensteinJulie N Jaquaya Coyle, PA-C 06/08/2016 8:30 PM

## 2016-06-10 ENCOUNTER — Telehealth: Payer: Self-pay | Admitting: Advanced Practice Midwife

## 2016-06-10 ENCOUNTER — Inpatient Hospital Stay (HOSPITAL_COMMUNITY)
Admission: AD | Admit: 2016-06-10 | Discharge: 2016-06-10 | Discharge status: Against Medical Advice | Payer: Managed Care, Other (non HMO) | Source: Ambulatory Visit | Attending: Obstetrics & Gynecology | Admitting: Obstetrics & Gynecology

## 2016-06-10 DIAGNOSIS — O00109 Unspecified tubal pregnancy without intrauterine pregnancy: Secondary | ICD-10-CM

## 2016-06-10 DIAGNOSIS — O001 Tubal pregnancy without intrauterine pregnancy: Secondary | ICD-10-CM | POA: Diagnosis present

## 2016-06-10 LAB — HCG, QUANTITATIVE, PREGNANCY: hCG, Beta Chain, Quant, S: 297 m[IU]/mL — ABNORMAL HIGH (ref <5)

## 2016-06-10 NOTE — Telephone Encounter (Signed)
Pt called requesting hcg results after leaving AMA after Day 7 MTX labs drawn.  Notified pt of appropriate drop in hcg.  Ectopic precautions reviewed.  Message sent to clinic f/u appointment/hcg in 1 week.

## 2016-06-10 NOTE — MAU Note (Signed)
Pt states she has to leave immediately after having her blood drawn, [place] is [place] to [place] for [place] brother[place] wedding tomorrow.  Informed pt that NP will need to speak with her after her lab draw.  Pt left after having blood drawn, did not wait for nursing staff or NP to speak with her before leaving.

## 2016-06-10 NOTE — MAU Note (Signed)
Pt here for BHCG post MTX, pt unable to come tomorrow due to brother's wedding.  Has occasional mild lower abd pain, has spotting.

## 2016-06-10 NOTE — MAU Provider Note (Signed)
Candyce ChurnChristine Barman is a 35 y.o. Unknown G1P0 who presents for day 7 labs s/p MTX for ectopic pregnancy.  Patient left AMA immediately after lab draw so I was not able to assess patient. Per RN, pt denied abdominal pain or vaginal bleeding.   Pt received MTX 6/11. She is here a day early b/c she is going out of town tonight for a family event.   BP 119/68 mmHg  Pulse 76  Temp(Src) 98 F (36.7 C) (Oral)  Resp 16  LMP 05/11/2016   Component     Latest Ref Rng 06/05/2016 06/08/2016 06/10/2016  HCG, Beta Chain, Quant, S     <5 mIU/mL 568 (H) 453 (H) 297 (H)   Patient called to inquire about her results after leaving AMA. Informed patient that BHCG had dropped & that she would need to f/u weekly for BHCG in [facility]. Agreeable to plan. Stressed importance of follow up. Also stressed importance of seeking immediate medical treatment for s/s ruptured ectopic.   Judeth HornErin Doreena Maulden, NP

## 2016-06-28 ENCOUNTER — Inpatient Hospital Stay (HOSPITAL_COMMUNITY)
Admission: AD | Admit: 2016-06-28 | Discharge: 2016-06-28 | Disposition: A | Discharge status: Home / Self Care | Payer: Managed Care, Other (non HMO) | Source: Ambulatory Visit | Attending: Obstetrics & Gynecology | Admitting: Obstetrics & Gynecology

## 2016-06-28 DIAGNOSIS — O009 Unspecified ectopic pregnancy without intrauterine pregnancy: Secondary | ICD-10-CM | POA: Insufficient documentation

## 2016-06-28 DIAGNOSIS — Z87891 Personal history of nicotine dependence: Secondary | ICD-10-CM | POA: Insufficient documentation

## 2016-06-28 DIAGNOSIS — Z09 Encounter for follow-up examination after completed treatment for conditions other than malignant neoplasm: Secondary | ICD-10-CM

## 2016-06-28 LAB — HCG, QUANTITATIVE, PREGNANCY: HCG, BETA CHAIN, QUANT, S: 1 m[IU]/mL (ref <5)

## 2016-06-28 NOTE — Discharge Instructions (Signed)

## 2016-06-28 NOTE — MAU Note (Signed)
Pt presents to MAU for follow up after methotrexate injection given for ectopic pregnancy. Injection was given on June the 11th. Pt has had follow up once  after the injection but had a trip out of the country and just returned yesterday.

## 2016-06-28 NOTE — MAU Provider Note (Signed)
  History     CSN: [id number]  Arrival date and time: 06/28/16 1454   None     No chief complaint on file.  HPI Janice ChurnChristine Bradley is 35 y.o. G1P0 presents for repeat BHCG after dx of right ectopic pregnancy and treatment with MTX on 6/11.  Was last seen on 6/16 for Day 7 f/u after  MTX- BHCG had dropped from 586 on 6/11; 453 on 6/14 to 297 on 6/16.  Instructed to have labs drawn weekly.  She has been out of the country; missed last week's f/u labs.  She denies vaginal bleeding and abdominal pain.   Past Medical History  Diagnosis Date  . Medical history non-contributory     Past Surgical History  Procedure Laterality Date  . No past surgeries      No family history on file.  Social History  Substance Use Topics  . Smoking status: Former Games developermoker  . Smokeless tobacco: Not on file  . Alcohol Use: Yes     Comment: 1-2 glasses a wine a month    Allergies: No Known Allergies  Prescriptions prior to admission  Medication Sig Dispense Refill Last Dose  . oxyCODONE-acetaminophen (PERCOCET/ROXICET) 5-325 MG tablet Take 1-2 tablets by mouth every 6 (six) hours as needed for severe pain. 15 tablet 0     Review of Systems  Constitutional: Negative for fever and chills.  Gastrointestinal: Negative for abdominal pain.  Genitourinary:       Neg for vaginal bleeding.   Physical Exam   Last menstrual period 05/11/2016.  Physical Exam  Constitutional: She is oriented to person, place, and time. She appears well-developed and well-nourished. No distress.  Genitourinary:  Not indicated for today's exam.  Neurological: She is alert and oriented to person, place, and time.  Psychiatric: She has a normal mood and affect. Her behavior is normal. Thought content normal.   Results for orders placed or performed during the hospital encounter of 06/28/16 (from the past 24 hour(s))  hCG, quantitative, pregnancy     Status: None   Collection Time: 06/28/16  3:02 PM  Result Value Ref Range   hCG, Beta Chain, Quant, S 1 <5 mIU/mL   MAU Course  Procedures  MDM MSE  Assessment and Plan  A:  Follow up for right ectopic pregnancy treated with MTX      Pregnancy resolved.  BHCG  1  P:  Results given to patient       Patient has list of OB GYNs and plans to make appt.    Janice Bradley,[name] 06/28/2016, 2:57 PM

## 2017-04-08 ENCOUNTER — Encounter (HOSPITAL_COMMUNITY): Payer: Self-pay

## 2017-04-21 ENCOUNTER — Inpatient Hospital Stay (HOSPITAL_COMMUNITY): Payer: Managed Care, Other (non HMO)

## 2017-04-21 ENCOUNTER — Encounter (HOSPITAL_COMMUNITY): Payer: Self-pay | Admitting: *Deleted

## 2017-04-21 ENCOUNTER — Inpatient Hospital Stay (HOSPITAL_COMMUNITY)
Admission: AD | Admit: 2017-04-21 | Discharge: 2017-04-22 | Disposition: A | Discharge status: Home / Self Care | Payer: Managed Care, Other (non HMO) | Source: Ambulatory Visit | Attending: Obstetrics & Gynecology | Admitting: Obstetrics & Gynecology

## 2017-04-21 DIAGNOSIS — Z87891 Personal history of nicotine dependence: Secondary | ICD-10-CM | POA: Insufficient documentation

## 2017-04-21 DIAGNOSIS — O009 Unspecified ectopic pregnancy without intrauterine pregnancy: Secondary | ICD-10-CM | POA: Insufficient documentation

## 2017-04-21 DIAGNOSIS — O209 Hemorrhage in early pregnancy, unspecified: Secondary | ICD-10-CM

## 2017-04-21 DIAGNOSIS — O00101 Right tubal pregnancy without intrauterine pregnancy: Secondary | ICD-10-CM

## 2017-04-21 DIAGNOSIS — Z3A01 Less than 8 weeks gestation of pregnancy: Secondary | ICD-10-CM | POA: Insufficient documentation

## 2017-04-21 DIAGNOSIS — R109 Unspecified abdominal pain: Secondary | ICD-10-CM | POA: Diagnosis present

## 2017-04-21 LAB — URINALYSIS, ROUTINE W REFLEX MICROSCOPIC
Bilirubin Urine: NEGATIVE
Glucose, UA: NEGATIVE mg/dL
Ketones, ur: 5 mg/dL — AB
Leukocytes, UA: NEGATIVE
Nitrite: NEGATIVE
PROTEIN: NEGATIVE mg/dL
SPECIFIC GRAVITY, URINE: 1.025 (ref 1.005–1.030)
pH: 6 (ref 5.0–8.0)

## 2017-04-21 LAB — CBC WITH DIFFERENTIAL/PLATELET
Basophils Absolute: 0 10*3/uL (ref 0.0–0.1)
Basophils Relative: 0 %
EOS ABS: 0 10*3/uL (ref 0.0–0.7)
Eosinophils Relative: 0 %
HCT: 38.9 % (ref 36.0–46.0)
Hemoglobin: 13.1 g/dL (ref 12.0–15.0)
LYMPHS ABS: 1.8 10*3/uL (ref 0.7–4.0)
Lymphocytes Relative: 19 %
MCH: 31 pg (ref 26.0–34.0)
MCHC: 33.7 g/dL (ref 30.0–36.0)
MCV: 92.2 fL (ref 78.0–100.0)
MONO ABS: 0.4 10*3/uL (ref 0.1–1.0)
MONOS PCT: 5 %
NEUTROS ABS: 7.4 10*3/uL (ref 1.7–7.7)
Neutrophils Relative %: 76 %
Platelets: 255 10*3/uL (ref 150–400)
RBC: 4.22 MIL/uL (ref 3.87–5.11)
RDW: 13.4 % (ref 11.5–15.5)
WBC: 9.8 10*3/uL (ref 4.0–10.5)

## 2017-04-21 LAB — CREATININE, SERUM
CREATININE: 0.61 mg/dL (ref 0.44–1.00)
GFR calc Af Amer: >60 mL/min (ref >60)
GFR calc non Af Amer: >60 mL/min (ref >60)

## 2017-04-21 LAB — HCG, QUANTITATIVE, PREGNANCY: HCG, BETA CHAIN, QUANT, S: 3285 m[IU]/mL — AB (ref <5)

## 2017-04-21 LAB — WET PREP, GENITAL
SPERM: NONE SEEN
Trich, Wet Prep: NONE SEEN
Yeast Wet Prep HPF POC: NONE SEEN

## 2017-04-21 LAB — AST: AST: 13 U/L — ABNORMAL LOW (ref 15–41)

## 2017-04-21 LAB — BUN: BUN: 11 mg/dL (ref 6–20)

## 2017-04-21 LAB — POCT PREGNANCY, URINE: PREG TEST UR: POSITIVE — AB

## 2017-04-21 MED ORDER — METHOTREXATE INJECTION FOR WOMEN'S HOSPITAL
50.0000 mg/m2 | Freq: Once | INTRAMUSCULAR | Status: AC
  Administered 2017-04-22: 85 mg via INTRAMUSCULAR
  Filled 2017-04-21: qty 1.7

## 2017-04-21 NOTE — MAU Note (Signed)
Missed my period a wk ago and took upt and was positive. Last June had ectopic Tonight was going to visit family in [place] and had sudden abd pain and a lot of bleeding. Passed something weird and was dizzy and just pulled over as I was driving. Now having mild cramping and spotting. Had MTX with ectopic in June and treated here

## 2017-04-21 NOTE — MAU Provider Note (Signed)
History     CSN: [id number]  Arrival date and time: 04/21/17 4098   First Provider Initiated Contact with Patient 04/21/17 2142      Chief Complaint  Patient presents with  . Abdominal Pain  . Vaginal Bleeding   HPI [name] is a 36 y.o. G2P0010 at [redacted]w[redacted]d who presents to MAU today with complaint of vaginal bleeding and lower abdominal pain. The patient was driving tonight when she had sudden onset of severe pain. She then had a very heavy bleed and passed something that resembled tissue. She states much less bleeding now, just slightly more than spotting. She rates her pain at 2/10 currently. She states subjective fever earlier, but none now. She has had nausea intermittently without vomiting, diarrhea or constipation. Last intercourse was Monday.    OB History    Gravida Para Term Preterm AB Living   2       1     SAB TAB Ectopic Multiple Live Births       1          Past Medical History:  Diagnosis Date  . Medical history non-contributory     Past Surgical History:  Procedure Laterality Date  . NO PAST SURGERIES      History reviewed. No pertinent family history.  Social History  Substance Use Topics  . Smoking status: Former Games developer  . Smokeless tobacco: Never Used  . Alcohol use Yes     Comment: 1-2 glasses a wine a month    Allergies: No Known Allergies  No prescriptions prior to admission.    Review of Systems  Constitutional: Negative for fever.  Gastrointestinal: Positive for abdominal pain and nausea. Negative for constipation, diarrhea and vomiting.  Genitourinary: Positive for vaginal bleeding. Negative for dysuria, frequency, urgency and vaginal discharge.   Physical Exam   Blood pressure 112/68, pulse 84, temperature 98 F (36.7 C), resp. rate 18, height  (1.676 m), weight 144 lb (65.3 kg), last menstrual period 03/10/2017, unknown if currently breastfeeding.  Physical Exam  Nursing note and vitals reviewed. Constitutional: She  is oriented to person, place, and time. She appears well-developed and well-nourished. No distress.  HENT:  Head: Normocephalic and atraumatic.  Cardiovascular: Normal rate.   Respiratory: Effort normal.  GI: Soft. She exhibits no distension and no mass. There is no tenderness. There is no rebound and no guarding.  Genitourinary: Uterus is enlarged (slightly). Uterus is not tender. Cervix exhibits no motion tenderness, no discharge and no friability. Right adnexum displays fullness. Right adnexum displays no mass and no tenderness. Left adnexum displays no mass and no tenderness. There is bleeding (small) in the vagina. No vaginal discharge found.  Neurological: She is alert and oriented to person, place, and time.  Skin: Skin is warm and dry. No erythema.  Psychiatric: She has a normal mood and affect.  Dilation: Closed Effacement (%): Thick Cervical Position: Posterior Exam by:: Vonzella Nipple, PA-C   Results for orders placed or performed during the hospital encounter of 04/21/17 (from the past 24 hour(s))  Urinalysis, Routine w reflex microscopic     Status: Abnormal   Collection Time: 04/21/17  8:35 PM  Result Value Ref Range   Color, Urine YELLOW YELLOW   APPearance CLEAR CLEAR   Specific Gravity, Urine 1.025 1.005 - 1.030   pH 6.0 5.0 - 8.0   Glucose, UA NEGATIVE NEGATIVE mg/dL   Hgb urine dipstick LARGE (A) NEGATIVE   Bilirubin Urine NEGATIVE NEGATIVE  Ketones, ur 5 (A) NEGATIVE mg/dL   Protein, ur NEGATIVE NEGATIVE mg/dL   Nitrite NEGATIVE NEGATIVE   Leukocytes, UA NEGATIVE NEGATIVE   RBC / HPF TOO NUMEROUS TO COUNT 0 - 5 RBC/hpf   WBC, UA 0-5 0 - 5 WBC/hpf   Bacteria, UA RARE (A) NONE SEEN   Squamous Epithelial / LPF 0-5 (A) NONE SEEN   Mucous PRESENT   Pregnancy, urine POC     Status: Abnormal   Collection Time: 04/21/17  9:07 PM  Result Value Ref Range   Preg Test, Ur POSITIVE (A) NEGATIVE  CBC with Differential/Platelet     Status: None   Collection Time: 04/21/17   9:33 PM  Result Value Ref Range   WBC 9.8 4.0 - 10.5 K/uL   RBC 4.22 3.87 - 5.11 MIL/uL   Hemoglobin 13.1 12.0 - 15.0 g/dL   HCT 16.1 09.6 - 04.5 %   MCV 92.2 78.0 - 100.0 fL   MCH 31.0 26.0 - 34.0 pg   MCHC 33.7 30.0 - 36.0 g/dL   RDW 40.9 81.1 - 91.4 %   Platelets 255 150 - 400 K/uL   Neutrophils Relative % 76 %   Neutro Abs 7.4 1.7 - 7.7 K/uL   Lymphocytes Relative 19 %   Lymphs Abs 1.8 0.7 - 4.0 K/uL   Monocytes Relative 5 %   Monocytes Absolute 0.4 0.1 - 1.0 K/uL   Eosinophils Relative 0 %   Eosinophils Absolute 0.0 0.0 - 0.7 K/uL   Basophils Relative 0 %   Basophils Absolute 0.0 0.0 - 0.1 K/uL  hCG, quantitative, pregnancy     Status: Abnormal   Collection Time: 04/21/17  9:33 PM  Result Value Ref Range   hCG, Beta Chain, Quant, S 3,285 (H) <5 mIU/mL  AST     Status: Abnormal   Collection Time: 04/21/17  9:33 PM  Result Value Ref Range   AST 13 (L) 15 - 41 U/L  BUN     Status: None   Collection Time: 04/21/17  9:33 PM  Result Value Ref Range   BUN 11 6 - 20 mg/dL  Creatinine, serum     Status: None   Collection Time: 04/21/17  9:33 PM  Result Value Ref Range   Creatinine, Ser 0.61 0.44 - 1.00 mg/dL   GFR calc non Af Amer >60 >60 mL/min   GFR calc Af Amer >60 >60 mL/min  Wet prep, genital     Status: Abnormal   Collection Time: 04/21/17 10:00 PM  Result Value Ref Range   Yeast Wet Prep HPF POC NONE SEEN NONE SEEN   Trich, Wet Prep NONE SEEN NONE SEEN   Clue Cells Wet Prep HPF POC PRESENT (A) NONE SEEN   WBC, Wet Prep HPF POC MODERATE (A) NONE SEEN   Sperm NONE SEEN    US Ob Comp Less 14 Wks  Result Date: 04/21/2017 CLINICAL DATA:  Bleeding with pelvic pain EXAM: OBSTETRIC <14 WK [place] AND TRANSVAGINAL OB US TECHNIQUE: Both transabdominal and transvaginal ultrasound examinations were performed for complete evaluation of the gestation as well as the maternal uterus, adnexal regions, and pelvic cul-de-sac. Transvaginal technique was performed to assess early  pregnancy. COMPARISON:  06/05/2016 FINDINGS: Intrauterine gestational sac: Not visualized Yolk sac:  Not visualized Embryo:  Not visualized Maternal uterus/adnexae: Left ovary is within normal limits and measures 1.8 x 2.6 x 1.4 cm. The right ovary measures 3.6 x 1.7 x 2 cm. Abutting the right ovary in the right  adnexa is a complex heterogeneous mass with increased peripheral echogenicity and central hypoechogenicity, this measures 2 x 1.7 x 1.3 cm. Increased peripheral flow around the echogenic mass. Trace amount of complex fluid in the cul-de-sac. IMPRESSION: 1. No intrauterine pregnancy visualized. Heterogenous echogenic mass in the right adnexum measuring 2 cm with peripheral echogenicity, constellation of findings is suspicious for an ectopic pregnancy. 2. Trace amount of complex fluid in the cul-de-sac. Critical Value/emergent results were called by telephone at the time of interpretation on 04/21/2017 at 10:47 pm to Dr. Vonzella Nipple , who verbally acknowledged these results. Electronically Signed   By: Jasmine Pang M.D.   On: 04/21/2017 22:47   US Ob Transvaginal  Result Date: 04/21/2017 CLINICAL DATA:  Bleeding with pelvic pain EXAM: OBSTETRIC <14 WK [place] AND TRANSVAGINAL OB US TECHNIQUE: Both transabdominal and transvaginal ultrasound examinations were performed for complete evaluation of the gestation as well as the maternal uterus, adnexal regions, and pelvic cul-de-sac. Transvaginal technique was performed to assess early pregnancy. COMPARISON:  06/05/2016 FINDINGS: Intrauterine gestational sac: Not visualized Yolk sac:  Not visualized Embryo:  Not visualized Maternal uterus/adnexae: Left ovary is within normal limits and measures 1.8 x 2.6 x 1.4 cm. The right ovary measures 3.6 x 1.7 x 2 cm. Abutting the right ovary in the right adnexa is a complex heterogeneous mass with increased peripheral echogenicity and central hypoechogenicity, this measures 2 x 1.7 x 1.3 cm. Increased peripheral flow around  the echogenic mass. Trace amount of complex fluid in the cul-de-sac. IMPRESSION: 1. No intrauterine pregnancy visualized. Heterogenous echogenic mass in the right adnexum measuring 2 cm with peripheral echogenicity, constellation of findings is suspicious for an ectopic pregnancy. 2. Trace amount of complex fluid in the cul-de-sac. Critical Value/emergent results were called by telephone at the time of interpretation on 04/21/2017 at 10:47 pm to Dr. Vonzella Nipple , who verbally acknowledged these results. Electronically Signed   By: Jasmine Pang M.D.   On: 04/21/2017 22:47    MAU Course  Procedures None  MDM +UPT UA, wet prep, GC/chlamydia, CBC, quant hCG, HIV, RPR and [place] today to rule out ectopic pregnancy Discussed patient with Dr. Despina Hidden. Agrees with plan for MTX today.  Discussed plan with patient and husband. Agree to have MTX today.   Assessment and Plan  A: Right ectopic pregnancy  P: Discharge home Bleeding/ectopic precautions discussed Patient advised to follow-up with [facility] on Tuesday for day #4 MTX labs Patient may return to MAU as needed or if her condition were to change or worsen  Marny Lowenstein, PA-C  04/22/2017, 2:05 AM

## 2017-04-22 ENCOUNTER — Telehealth (HOSPITAL_COMMUNITY): Payer: Self-pay | Admitting: *Deleted

## 2017-04-22 ENCOUNTER — Telehealth (HOSPITAL_COMMUNITY): Payer: Self-pay

## 2017-04-22 DIAGNOSIS — O00101 Right tubal pregnancy without intrauterine pregnancy: Secondary | ICD-10-CM

## 2017-04-22 LAB — HIV ANTIBODY (ROUTINE TESTING W REFLEX): HIV Screen 4th Generation wRfx: NONREACTIVE

## 2017-04-22 LAB — RPR: RPR Ser Ql: NONREACTIVE

## 2017-04-22 NOTE — Progress Notes (Signed)
Written and verbal d/c instructions given and understanding voiced. To return TUes at 1100 for repeat BHCG in clinic

## 2017-04-22 NOTE — Discharge Instructions (Signed)
Methotrexate Treatment for an Ectopic Pregnancy, Care After °Refer to this sheet in the next few weeks. These instructions provide you with information on caring for yourself after your procedure. Your health care provider may also give you more specific instructions. Your treatment has been planned according to current medical practices, but problems sometimes occur. Call your health care provider if you have any problems or questions after your procedure. °What can I expect after the procedure? °You may have some abdominal cramping, vaginal bleeding, and fatigue in the first few days after taking methotrexate. Some other possible side effects of methotrexate include: °· Nausea. °· Vomiting. °· Diarrhea. °· Mouth sores. °· Swelling or irritation of the lining of your lungs (pneumonitis). °· Liver damage. °· Hair loss. ° °Follow these instructions at home: °After you have received the methotrexate medicine, you need to be careful of your activities and watch your condition for several weeks. It may take 1 week before your hormone levels return to normal. °Activity °· Do not have sexual intercourse until your health care provider says it is safe to do so. °· You may resume your usual diet. °· Limit strenuous activity. °· Do not drink alcohol. °General instructions °· Do not take aspirin, ibuprofen, or naproxen (nonsteroidal anti-inflammatory drugs [NSAIDs]). °· Do not take folic acid, prenatal vitamins, or other vitamins that contain folic acid. °· Avoid traveling too far away from your health care provider. °· Keep all follow-up visits as told by your health care provider. This is important. °Contact a health care provider if: °· You cannot control your nausea and vomiting. °· You cannot control your diarrhea. °· You have sores in your mouth and want treatment. °· You need pain medicine for your abdominal pain. °· You have a rash. °· You are having a reaction to the medicine. °Get help right away if: °· You have  increasing abdominal or pelvic pain. °· You notice increased bleeding. °· You feel light-headed, or you faint. °· You have shortness of breath. °· Your heart rate increases. °· You have a cough. °· You have chills. °· You have a fever. °This information is not intended to replace advice given to you by your health care provider. Make sure you discuss any questions you have with your health care provider. °Document Released: 12/01/2011 Document Revised: 05/19/2016 Document Reviewed: 09/30/2013 °Elsevier Interactive Patient Education © 2017 [employer] °Ectopic Pregnancy °An ectopic pregnancy happens when a fertilized egg grows outside the uterus. A pregnancy cannot live outside of the uterus. This problem often happens in the fallopian tube. It is often caused by damage to the fallopian tube. °If this problem is found early, you may be treated with medicine. If your tube tears or bursts open (ruptures), you will bleed inside. This is an emergency. You will need surgery. Get help right away. °What are the signs or symptoms? °You may have normal pregnancy symptoms at first. These include: °· Missing your period. °· Feeling sick to your stomach (nauseous). °· Being tired. °· Having tender breasts. ° °Then, you may start to have symptoms that are not normal. These include: °· Pain with sex (intercourse). °· Bleeding from the vagina. This includes light bleeding (spotting). °· Belly (abdomen) or lower belly cramping or pain. This may be felt on one side. °· A fast heartbeat (pulse). °· Passing out (fainting) after going poop (bowel movement). ° °If your tube tears, you may have symptoms such as: °· Really bad pain in the belly or lower belly. This   happens suddenly. °· Dizziness. °· Passing out. °· Shoulder pain. ° °Get help right away if: °You have any of these symptoms. This is an emergency. °This information is not intended to replace advice given to you by your health care provider. Make sure you discuss any  questions you have with your health care provider. °Document Released: 03/10/2009 Document Revised: 05/19/2016 Document Reviewed: 07/24/2013 °Elsevier Interactive Patient Education © 2017 [employer] Inc. ° °

## 2017-04-22 NOTE — Telephone Encounter (Signed)
Triage RN called back to MAU. States husband refuses to come back and will just give his wife advil. MAU RN told Triage Nurse Line  To encourage patient to return to MAU due to increasing pain and related risks of ectopic. Also to remind that advil is contraindicated with methotrexate. Triage RN states she will advise husband of patient.

## 2017-04-24 ENCOUNTER — Other Ambulatory Visit: Payer: Managed Care, Other (non HMO)

## 2017-04-24 DIAGNOSIS — O3680X0 Pregnancy with inconclusive fetal viability, not applicable or unspecified: Secondary | ICD-10-CM

## 2017-04-24 LAB — GC/CHLAMYDIA PROBE AMP (~~LOC~~) NOT AT ARMC
Chlamydia: NEGATIVE
Neisseria Gonorrhea: NEGATIVE

## 2017-04-25 ENCOUNTER — Telehealth: Payer: Self-pay | Admitting: General Practice

## 2017-04-25 ENCOUNTER — Ambulatory Visit: Payer: Managed Care, Other (non HMO)

## 2017-04-25 ENCOUNTER — Telehealth: Payer: Self-pay | Admitting: *Deleted

## 2017-04-25 LAB — BETA HCG QUANT (REF LAB): hCG Quant: 3168 m[IU]/mL

## 2017-04-25 NOTE — Telephone Encounter (Signed)
Called patient regarding bhcg results. No answer- left message stating we are trying to reach you regarding urgent results, please call us back.

## 2017-04-25 NOTE — Telephone Encounter (Signed)
Spoke with Dr Alysia Penna regarding bhcg levels. Patient's levels haven't changed since receiving MTX and needs to go to MAU immediately. Called patient, no answer- left message stating we are calling with urgent results please call back as soon as possible. Called emergency contact- no answer. Left same message. Will try again shortly.

## 2017-04-26 NOTE — Telephone Encounter (Addendum)
Pt left message today @ 1223 stating that she is calling back for her test results.  Pt's history reviewed with Dr. Despina Hidden who is presently on call.  He advised for pt to have repeat BHCG on Francena Zender #7 after receiving MTX.  Plan  Of care will be determined based on that lab result. Pt was called and informed of results and plan of care.  She agreed to repeat lab tomorrow morning however is not sure what time she can come in due to her job. It was agreed that I will call her tomorrow to schedule appt for lab only visit.  Pt voiced understanding of all information and instructions given.   5/3  0850  Spoke w/pt and she stated that she is still having a fair amount of pain. She agrees to come in today for lab draw between 10-11.  Pt was advised that she will need to be here no later than 11.  She voiced understanding and agreed.

## 2017-04-27 ENCOUNTER — Telehealth: Payer: Self-pay | Admitting: General Practice

## 2017-04-27 ENCOUNTER — Encounter (HOSPITAL_COMMUNITY): Payer: Self-pay

## 2017-04-27 ENCOUNTER — Ambulatory Visit (HOSPITAL_COMMUNITY)
Admission: AD | Admit: 2017-04-27 | Discharge: 2017-04-28 | Disposition: A | Discharge status: Home / Self Care | Payer: Managed Care, Other (non HMO) | Source: Ambulatory Visit | Attending: Obstetrics and Gynecology | Admitting: Obstetrics and Gynecology

## 2017-04-27 ENCOUNTER — Inpatient Hospital Stay (HOSPITAL_COMMUNITY): Payer: Managed Care, Other (non HMO)

## 2017-04-27 ENCOUNTER — Ambulatory Visit: Payer: Managed Care, Other (non HMO) | Admitting: General Practice

## 2017-04-27 DIAGNOSIS — K661 Hemoperitoneum: Secondary | ICD-10-CM

## 2017-04-27 DIAGNOSIS — O00101 Right tubal pregnancy without intrauterine pregnancy: Secondary | ICD-10-CM | POA: Insufficient documentation

## 2017-04-27 DIAGNOSIS — Z3A01 Less than 8 weeks gestation of pregnancy: Secondary | ICD-10-CM | POA: Insufficient documentation

## 2017-04-27 DIAGNOSIS — Z87891 Personal history of nicotine dependence: Secondary | ICD-10-CM | POA: Diagnosis not present

## 2017-04-27 DIAGNOSIS — O26891 Other specified pregnancy related conditions, first trimester: Secondary | ICD-10-CM | POA: Diagnosis not present

## 2017-04-27 DIAGNOSIS — O26899 Other specified pregnancy related conditions, unspecified trimester: Secondary | ICD-10-CM

## 2017-04-27 DIAGNOSIS — O209 Hemorrhage in early pregnancy, unspecified: Secondary | ICD-10-CM

## 2017-04-27 DIAGNOSIS — R102 Pelvic and perineal pain: Secondary | ICD-10-CM | POA: Diagnosis not present

## 2017-04-27 DIAGNOSIS — O3680X0 Pregnancy with inconclusive fetal viability, not applicable or unspecified: Secondary | ICD-10-CM

## 2017-04-27 DIAGNOSIS — R109 Unspecified abdominal pain: Secondary | ICD-10-CM

## 2017-04-27 DIAGNOSIS — O00109 Unspecified tubal pregnancy without intrauterine pregnancy: Secondary | ICD-10-CM

## 2017-04-27 LAB — CBC
HCT: 38.8 % (ref 36.0–46.0)
HEMOGLOBIN: 13 g/dL (ref 12.0–15.0)
MCH: 30.7 pg (ref 26.0–34.0)
MCHC: 33.5 g/dL (ref 30.0–36.0)
MCV: 91.7 fL (ref 78.0–100.0)
PLATELETS: 296 10*3/uL (ref 150–400)
RBC: 4.23 MIL/uL (ref 3.87–5.11)
RDW: 13.2 % (ref 11.5–15.5)
WBC: 8.4 10*3/uL (ref 4.0–10.5)

## 2017-04-27 LAB — TYPE AND SCREEN
ABO/RH(D): A POS
Antibody Screen: NEGATIVE

## 2017-04-27 LAB — HCG, QUANTITATIVE, PREGNANCY
HCG, BETA CHAIN, QUANT, S: 3444 m[IU]/mL — AB (ref <5)
HCG, BETA CHAIN, QUANT, S: 3067 m[IU]/mL — AB (ref <5)

## 2017-04-27 LAB — ABO/RH: ABO/RH(D): A POS

## 2017-04-27 MED ORDER — LACTATED RINGERS IV SOLN
INTRAVENOUS | Status: DC
  Administered 2017-04-27 – 2017-04-28 (×2): via INTRAVENOUS

## 2017-04-27 MED ORDER — ONDANSETRON HCL 4 MG/2ML IJ SOLN
INTRAMUSCULAR | Status: AC
  Filled 2017-04-27: qty 2

## 2017-04-27 MED ORDER — FAMOTIDINE IN NACL 20-0.9 MG/50ML-% IV SOLN
20.0000 mg | Freq: Once | INTRAVENOUS | Status: AC
  Administered 2017-04-27: 20 mg via INTRAVENOUS
  Filled 2017-04-27: qty 50

## 2017-04-27 MED ORDER — PROPOFOL 10 MG/ML IV BOLUS
INTRAVENOUS | Status: AC
  Filled 2017-04-27: qty 20

## 2017-04-27 MED ORDER — FENTANYL CITRATE (PF) 250 MCG/5ML IJ SOLN
INTRAMUSCULAR | Status: AC
  Filled 2017-04-27: qty 5

## 2017-04-27 MED ORDER — SOD CITRATE-CITRIC ACID 500-334 MG/5ML PO SOLN
30.0000 mL | Freq: Once | ORAL | Status: AC
  Administered 2017-04-27: 30 mL via ORAL
  Filled 2017-04-27: qty 15

## 2017-04-27 MED ORDER — ROCURONIUM BROMIDE 100 MG/10ML IV SOLN
INTRAVENOUS | Status: AC
  Filled 2017-04-27: qty 1

## 2017-04-27 MED ORDER — MIDAZOLAM HCL 2 MG/2ML IJ SOLN
INTRAMUSCULAR | Status: AC
  Filled 2017-04-27: qty 2

## 2017-04-27 MED ORDER — LIDOCAINE HCL (CARDIAC) 20 MG/ML IV SOLN
INTRAVENOUS | Status: AC
  Filled 2017-04-27: qty 5

## 2017-04-27 NOTE — Telephone Encounter (Signed)
Called patient regarding bhcg results and urgent need to go to MAU immediately. Patient verbalized understanding and states she will go later on this evening. Told patient this cannot wait until then she needs to go immediately. Patient states she doesn't like how this whole process has occurred and wants to know if [facility] can [facility]. Patient states she was in a lot of pain the other night and no one gave her pain medication of any kind even tylenol and she was clearly hurting. Patient states this is an entirely different experience than the first time this happened last June. Patient states no one explained to her the process, just gave her a shot and sent her home. Apologized to patient for her experience and that we always strive for great care. Discussed with patient that she may transfer her care but there really isn't time for that given the urgency. Informed patient of all bhcg results and that the result from today is the one that determines if further steps are needed or not. Told patient that the #4 labs is to see how the medication is working so far but that action isn't taken on day 4 labs unless there is something concerning such as severe increased pain. Patient states no one even called her with results from the other day. Told patient I actually called her multiple times on Tuesday for results and left multiple messages as well as a message on her husband's phone. Patient states she was working so she couldn't get to her phone until later in the evening. Explained to patient that this cannot wait and she needs to go to MAU right now. Patient states she can't until later this afternoon. Told patient that this was serious and is very time sensitive. Told patient this cannot wait a few hours & the worse scenario of this, is that she could rupture at any time and die. Patient states she understands but has something she cannot get out of. Patient states she is almost in [place] now for a job  interview at 2pm. Patient states she will leave after that and go to MAU. Told patient I will call MAU and let them know she is coming. Patient verbalized understanding and had no questions

## 2017-04-27 NOTE — H&P (Signed)
HPI  36 yo MW G2P0ectopic 2 here for follow up after MTX on 04-21-17. She was seen on 04-21-17 with pain and vaginal bleeding and a right ectopic was seen on u/s. She has a h/o right ectopic, treated with MTX in the past. Her day #7 QBHCG did not drop 15% from day #4 level. Her u/s today shows blood in the pelvis. She does report level 6-7 pelvic pain.   Past Medical History:  Diagnosis Date  . Medical history non-contributory     Past Surgical History:  Procedure Laterality Date  . NO PAST SURGERIES      No family history on file.  Social History  Substance Use Topics  . Smoking status: Former Games developermoker  . Smokeless tobacco: Never Used  . Alcohol use Yes     Comment: 1-2 glasses a wine a month    Allergies: No Known Allergies  Prescriptions Prior to Admission  Medication Sig Dispense Refill Last Dose  . Prenatal Vit-Min-FA-Fish Oil ([facility] PRENATAL GUMMY) 0.4-113.5 MG CHEW Chew 2 tablets by mouth daily.   04/20/2017 at Unknown time    Review of Systems Physical Exam   Blood pressure 116/79, pulse 74, temperature 98.5 F (36.9 C), temperature source Oral, resp. rate 18, height 5\' 6"  (1.676 m), weight 65 kg (143 lb 4 oz), last menstrual period 03/10/2017, unknown if currently breastfeeding.  Physical Exam  Heart- rrr Lungs- CTAB Abd- benign  MAU Course  Procedures  U/S as above   Assessment and Plan  Right ectopic pregnancy, likely ruptured. Will plan for laparoscopic removal of her right tube.   She understands the risks of surgery, including, but not to infection, bleeding, DVTs, damage to bowel, bladder, ureters. She wishes to proceed.     Allie BossierMyra C Rolen Conger 04/27/2017, 9:11 PM

## 2017-04-27 NOTE — MAU Note (Signed)
PT  SAYS SHE WAS HERE Friday NIGHT - CONFIRMED   ECTOPIC -  RECEIVED  METHOTREXATE -      THEN  HAD FOLLOW-UP  IN CLINIC- ON Monday -  THE NUMBERS DID NOT DROP LIKE   PROVIDER WANTED.        SO THEN  WENT BACK TO CLINIC  THIS MORN-  HAD LABS AND  QUANT  WAS HIGHER -   SO THEN  THEY CALLED  HER-  AND  TOLD   HER TO COME HERE.         HER CRAMPS AND NOW CONSTANT -- 6-7   AND RIGHT LEG FEELS NUMB/ TINGLING.     BLEEDING  IS LESS NOW  THAN  Friday -    IN TRIAGE - PAD -PANTYLINER-  SMALL AMT.

## 2017-04-27 NOTE — MAU Provider Note (Signed)
  History     CSN: [id number]  Arrival date and time: 04/27/17 1920   None     Chief Complaint  Patient presents with  . Follow-up   HPI  36 yo MW G2P0ectopic 2 here for follow up after MTX on 04-21-17. She was seen on 04-21-17 with pain and vaginal bleeding and a right ectopic was seen on u/s. She has a h/o right ectopic, treated with MTX in the past. Her day #7 QBHCG did not drop 15% from day #4 level. Her u/s today shows blood in the pelvis. She does report level 6-7 pelvic pain.   Past Medical History:  Diagnosis Date  . Medical history non-contributory     Past Surgical History:  Procedure Laterality Date  . NO PAST SURGERIES      No family history on file.  Social History  Substance Use Topics  . Smoking status: Former Games developermoker  . Smokeless tobacco: Never Used  . Alcohol use Yes     Comment: 1-2 glasses a wine a month    Allergies: No Known Allergies  Prescriptions Prior to Admission  Medication Sig Dispense Refill Last Dose  . Prenatal Vit-Min-FA-Fish Oil ([facility] PRENATAL GUMMY) 0.4-113.5 MG CHEW Chew 2 tablets by mouth daily.   04/20/2017 at Unknown time    Review of Systems Physical Exam   Blood pressure 116/79, pulse 74, temperature 98.5 F (36.9 C), temperature source Oral, resp. rate 18, height 5\' 6"  (1.676 m), weight 65 kg (143 lb 4 oz), last menstrual period 03/10/2017, unknown if currently breastfeeding.  Physical Exam  Heart- rrr Lungs- CTAB Abd- benign  MAU Course  Procedures  U/S as above   Assessment and Plan  Right ectopic pregnancy, likely ruptured. Will plan for laparoscopic removal of her right tube.   She understands the risks of surgery, including, but not to infection, bleeding, DVTs, damage to bowel, bladder, ureters. She wishes to proceed.     Allie BossierMyra C Aven Cegielski 04/27/2017, 9:11 PM

## 2017-04-27 NOTE — Progress Notes (Signed)
Patient here for stat bhcg. State she is having pain this morning radiating down into her thigh and bleeding as well. Discussed her waiting for results today and not leaving. Patient verbalized understanding. Notified by front office patient left office at 1145 against recommendation. Spoke with Dr Shawnie PonsPratt regarding results who recommends patient go to MAU immediately. Will call patient

## 2017-04-28 ENCOUNTER — Inpatient Hospital Stay (HOSPITAL_COMMUNITY): Payer: Managed Care, Other (non HMO) | Admitting: Anesthesiology

## 2017-04-28 ENCOUNTER — Encounter (HOSPITAL_COMMUNITY): Payer: Self-pay | Admitting: Anesthesiology

## 2017-04-28 ENCOUNTER — Encounter (HOSPITAL_COMMUNITY)
Admission: AD | Disposition: A | Discharge status: Home / Self Care | Payer: Self-pay | Source: Ambulatory Visit | Attending: Obstetrics and Gynecology

## 2017-04-28 DIAGNOSIS — O00101 Right tubal pregnancy without intrauterine pregnancy: Secondary | ICD-10-CM | POA: Diagnosis not present

## 2017-04-28 HISTORY — PX: DIAGNOSTIC LAPAROSCOPY WITH REMOVAL OF ECTOPIC PREGNANCY: SHX6449

## 2017-04-28 SURGERY — LAPAROSCOPY, WITH ECTOPIC PREGNANCY SURGICAL TREATMENT
Anesthesia: General | Site: Abdomen | Laterality: Right

## 2017-04-28 MED ORDER — HYDROMORPHONE HCL 1 MG/ML IJ SOLN
INTRAMUSCULAR | Status: AC
  Filled 2017-04-28: qty 1

## 2017-04-28 MED ORDER — OXYCODONE HCL 5 MG PO TABS
5.0000 mg | ORAL_TABLET | Freq: Four times a day (QID) | ORAL | 0 refills | Status: DC | PRN
Start: 2017-04-28 — End: 2018-01-26

## 2017-04-28 MED ORDER — LIDOCAINE HCL (CARDIAC) 20 MG/ML IV SOLN
INTRAVENOUS | Status: DC | PRN
  Administered 2017-04-28: 100 mg via INTRAVENOUS

## 2017-04-28 MED ORDER — KETOROLAC TROMETHAMINE 30 MG/ML IJ SOLN: 30.0000 mg | Freq: Once | INTRAMUSCULAR | Status: DC | PRN

## 2017-04-28 MED ORDER — IBUPROFEN 800 MG PO TABS: 800.0000 mg | ORAL_TABLET | Freq: Three times a day (TID) | ORAL | 1 refills | Status: DC | PRN

## 2017-04-28 MED ORDER — SUCCINYLCHOLINE CHLORIDE 20 MG/ML IJ SOLN
INTRAMUSCULAR | Status: DC | PRN
  Administered 2017-04-28: 120 mg via INTRAVENOUS

## 2017-04-28 MED ORDER — LACTATED RINGERS IR SOLN
Status: DC | PRN
  Administered 2017-04-28: 3000 mL

## 2017-04-28 MED ORDER — ONDANSETRON HCL 4 MG/2ML IJ SOLN: 4.0000 mg | Freq: Once | INTRAMUSCULAR | Status: DC | PRN

## 2017-04-28 MED ORDER — PROPOFOL 10 MG/ML IV BOLUS
INTRAVENOUS | Status: DC | PRN
  Administered 2017-04-28: 200 mg via INTRAVENOUS

## 2017-04-28 MED ORDER — ONDANSETRON HCL 4 MG/2ML IJ SOLN
INTRAMUSCULAR | Status: AC
  Filled 2017-04-28: qty 2

## 2017-04-28 MED ORDER — SUGAMMADEX SODIUM 200 MG/2ML IV SOLN
INTRAVENOUS | Status: DC | PRN
  Administered 2017-04-28: 130 mg via INTRAVENOUS

## 2017-04-28 MED ORDER — FENTANYL CITRATE (PF) 100 MCG/2ML IJ SOLN
INTRAMUSCULAR | Status: AC
  Filled 2017-04-28: qty 2

## 2017-04-28 MED ORDER — MIDAZOLAM HCL 5 MG/5ML IJ SOLN
INTRAMUSCULAR | Status: DC | PRN
  Administered 2017-04-28: 2 mg via INTRAVENOUS

## 2017-04-28 MED ORDER — ROCURONIUM BROMIDE 100 MG/10ML IV SOLN
INTRAVENOUS | Status: DC | PRN
  Administered 2017-04-28: 30 mg via INTRAVENOUS
  Administered 2017-04-28: 10 mg via INTRAVENOUS

## 2017-04-28 MED ORDER — MEPERIDINE HCL 25 MG/ML IJ SOLN: 6.2500 mg | INTRAMUSCULAR | Status: DC | PRN

## 2017-04-28 MED ORDER — SCOPOLAMINE 1 MG/3DAYS TD PT72
MEDICATED_PATCH | TRANSDERMAL | Status: AC
  Filled 2017-04-28: qty 1

## 2017-04-28 MED ORDER — ONDANSETRON HCL 4 MG/2ML IJ SOLN
INTRAMUSCULAR | Status: DC | PRN
  Administered 2017-04-28: 4 mg via INTRAVENOUS

## 2017-04-28 MED ORDER — SCOPOLAMINE 1 MG/3DAYS TD PT72
MEDICATED_PATCH | TRANSDERMAL | Status: DC | PRN
  Administered 2017-04-28: 1 via TRANSDERMAL

## 2017-04-28 MED ORDER — FENTANYL CITRATE (PF) 100 MCG/2ML IJ SOLN
INTRAMUSCULAR | Status: DC | PRN
  Administered 2017-04-28: 100 ug via INTRAVENOUS
  Administered 2017-04-28: 150 ug via INTRAVENOUS

## 2017-04-28 MED ORDER — BUPIVACAINE HCL (PF) 0.5 % IJ SOLN
INTRAMUSCULAR | Status: AC
  Filled 2017-04-28: qty 30

## 2017-04-28 MED ORDER — KETOROLAC TROMETHAMINE 30 MG/ML IJ SOLN
INTRAMUSCULAR | Status: AC
  Filled 2017-04-28: qty 1

## 2017-04-28 MED ORDER — ACETAMINOPHEN 325 MG PO TABS: 325.0000 mg | ORAL_TABLET | ORAL | Status: DC | PRN

## 2017-04-28 MED ORDER — KETOROLAC TROMETHAMINE 30 MG/ML IJ SOLN
INTRAMUSCULAR | Status: DC | PRN
  Administered 2017-04-28: 30 mg via INTRAVENOUS

## 2017-04-28 MED ORDER — SUCCINYLCHOLINE CHLORIDE 200 MG/10ML IV SOSY
PREFILLED_SYRINGE | INTRAVENOUS | Status: AC
  Filled 2017-04-28: qty 10

## 2017-04-28 MED ORDER — DEXAMETHASONE SODIUM PHOSPHATE 10 MG/ML IJ SOLN
INTRAMUSCULAR | Status: DC | PRN
  Administered 2017-04-28: 10 mg via INTRAVENOUS

## 2017-04-28 MED ORDER — HYDROMORPHONE HCL 1 MG/ML IJ SOLN
INTRAMUSCULAR | Status: DC | PRN
  Administered 2017-04-28: 1 mg via INTRAVENOUS

## 2017-04-28 MED ORDER — SUGAMMADEX SODIUM 200 MG/2ML IV SOLN
INTRAVENOUS | Status: AC
  Filled 2017-04-28: qty 2

## 2017-04-28 MED ORDER — OXYCODONE HCL 5 MG/5ML PO SOLN: 5.0000 mg | Freq: Once | ORAL | Status: DC | PRN

## 2017-04-28 MED ORDER — OXYCODONE HCL 5 MG PO TABS: 5.0000 mg | ORAL_TABLET | Freq: Once | ORAL | Status: DC | PRN

## 2017-04-28 MED ORDER — BUPIVACAINE HCL 0.5 % IJ SOLN
INTRAMUSCULAR | Status: DC | PRN
  Administered 2017-04-28: 10 mL

## 2017-04-28 MED ORDER — FENTANYL CITRATE (PF) 100 MCG/2ML IJ SOLN
25.0000 ug | INTRAMUSCULAR | Status: DC | PRN
  Administered 2017-04-28 (×3): 50 ug via INTRAVENOUS

## 2017-04-28 MED ORDER — ACETAMINOPHEN 160 MG/5ML PO SOLN: 325.0000 mg | ORAL | Status: DC | PRN

## 2017-04-28 SURGICAL SUPPLY — 36 items
APPLICATOR ARISTA FLEXITIP XL (MISCELLANEOUS) IMPLANT
BLADE SURG 15 STRL LF C SS BP (BLADE) ×1 IMPLANT
BLADE SURG 15 STRL SS (BLADE) ×1
CLOTH BEACON ORANGE TIMEOUT ST (SAFETY) ×2 IMPLANT
DERMABOND ADVANCED (GAUZE/BANDAGES/DRESSINGS) ×1
DERMABOND ADVANCED .7 DNX12 (GAUZE/BANDAGES/DRESSINGS) ×1 IMPLANT
DRSG OPSITE POSTOP 3X4 (GAUZE/BANDAGES/DRESSINGS) ×2 IMPLANT
DURAPREP 26ML APPLICATOR (WOUND CARE) ×2 IMPLANT
GLOVE BIO SURGEON STRL SZ7.5 (GLOVE) ×2 IMPLANT
GLOVE BIOGEL PI IND STRL 7.0 (GLOVE) ×2 IMPLANT
GLOVE BIOGEL PI IND STRL 8 (GLOVE) ×1 IMPLANT
GLOVE BIOGEL PI INDICATOR 7.0 (GLOVE) ×2
GLOVE BIOGEL PI INDICATOR 8 (GLOVE) ×1
GOWN STRL REUS W/TWL LRG LVL3 (GOWN DISPOSABLE) ×2 IMPLANT
GOWN STRL REUS W/TWL XL LVL3 (GOWN DISPOSABLE) ×2 IMPLANT
HEMOSTAT ARISTA ABSORB 3G PWDR (MISCELLANEOUS) IMPLANT
NDL INSUFF ACCESS 14 VERSASTEP (NEEDLE) ×2 IMPLANT
NS IRRIG 1000ML POUR BTL (IV SOLUTION) ×2 IMPLANT
PACK LAPAROSCOPY BASIN (CUSTOM PROCEDURE TRAY) ×2 IMPLANT
PACK TRENDGUARD 450 HYBRID PRO (MISCELLANEOUS) ×1 IMPLANT
PACK TRENDGUARD 600 HYBRD PROC (MISCELLANEOUS) IMPLANT
POUCH SPECIMEN RETRIEVAL 10MM (ENDOMECHANICALS) ×2 IMPLANT
PROTECTOR NERVE ULNAR (MISCELLANEOUS) ×4 IMPLANT
SCALPEL HARMONIC ACE (MISCELLANEOUS) ×2 IMPLANT
SET IRRIG TUBING LAPAROSCOPIC (IRRIGATION / IRRIGATOR) ×2 IMPLANT
SHEARS HARMONIC ACE PLUS 36CM (ENDOMECHANICALS) ×2 IMPLANT
SUT MNCRL AB 4-0 PS2 18 (SUTURE) ×2 IMPLANT
SUT VICRYL 0 UR6 27IN ABS (SUTURE) ×2 IMPLANT
TOWEL OR 17X24 6PK STRL BLUE (TOWEL DISPOSABLE) ×4 IMPLANT
TRAY FOLEY CATH SILVER 14FR (SET/KITS/TRAYS/PACK) ×2 IMPLANT
TRENDGUARD 450 HYBRID PRO PACK (MISCELLANEOUS) ×2
TRENDGUARD 600 HYBRID PROC PK (MISCELLANEOUS)
TROCAR BALLN 12MMX100 BLUNT (TROCAR) ×2 IMPLANT
TROCAR VERSASTEP PLUS 12MM (TROCAR) ×2 IMPLANT
TROCAR VERSASTEP PLUS 5MM (TROCAR) ×2 IMPLANT
WARMER LAPAROSCOPE (MISCELLANEOUS) ×2 IMPLANT

## 2017-04-28 NOTE — Anesthesia Procedure Notes (Signed)
Procedure Name: Intubation Date/Time: 04/28/2017 1:04 AM Performed by: Junious SilkGILBERT, Avneet Ashmore Pre-anesthesia Checklist: Patient identified, Emergency Drugs available, Patient being monitored, Timeout performed and Suction available Patient Re-evaluated:Patient Re-evaluated prior to inductionOxygen Delivery Method: Circle system utilized Preoxygenation: Pre-oxygenation with 100% oxygen Intubation Type: IV induction, Rapid sequence and Cricoid Pressure applied Laryngoscope Size: Miller and 2 Grade View: Grade I Tube type: Oral Tube size: 7.0 mm Number of attempts: 1 Airway Equipment and Method: Stylet Placement Confirmation: ETT inserted through vocal cords under direct vision,  positive ETCO2,  CO2 detector and breath sounds checked- equal and bilateral Secured at: 21 cm Tube secured with: Tape Dental Injury: Teeth and Oropharynx as per pre-operative assessment

## 2017-04-28 NOTE — Anesthesia Preprocedure Evaluation (Signed)
Anesthesia Evaluation  Patient identified by MRN, date of birth, ID band Patient awake    Reviewed: Allergy & Precautions, NPO status , Patient's Chart, lab work & pertinent test results  Airway Mallampati: I       Dental no notable dental hx.    Pulmonary former smoker,    Pulmonary exam normal        Cardiovascular Normal cardiovascular exam Rhythm:Regular Rate:Normal     Neuro/Psych negative neurological ROS  negative psych ROS   GI/Hepatic negative GI ROS,   Endo/Other  negative endocrine ROS  Renal/GU negative Renal ROS  negative genitourinary   Musculoskeletal negative musculoskeletal ROS (+)   Abdominal Normal abdominal exam  (+)   Peds  Hematology negative hematology ROS (+)   Anesthesia Other Findings   Reproductive/Obstetrics (+) Pregnancy                             Anesthesia Physical Anesthesia Plan  ASA: II  Anesthesia Plan: General   Post-op Pain Management:    Induction: Intravenous, Rapid sequence and Cricoid pressure planned  Airway Management Planned: Oral ETT  Additional Equipment:   Intra-op Plan:   Post-operative Plan: Extubation in OR  Informed Consent: I have reviewed the patients History and Physical, chart, labs and discussed the procedure including the risks, benefits and alternatives for the proposed anesthesia with the patient or authorized representative who has indicated his/her understanding and acceptance.   Dental advisory given  Plan Discussed with: CRNA and Surgeon  Anesthesia Plan Comments:         Anesthesia Quick Evaluation

## 2017-04-28 NOTE — Op Note (Signed)
Alasia Palazzi PROCEDURE DATE: 04/27/2017 - 04/28/2017  PREOPERATIVE DIAGNOSIS: Ruptured ectopic pregnancy POSTOPERATIVE DIAGNOSIS: Ruptured right fallopian tube ectopic pregnancy PROCEDURE: Laparoscopic right salpingectomy and removal of ectopic pregnancy SURGEON:  Dr. Nettie Elm ANESTHESIOLOGIST: Leilani Able, MD Anesthesiologist: Leilani Able, MD CRNA: Junious Silk, CRNA  INDICATIONS: 36 y.o. G2P0010 at [redacted]w[redacted]d here with the preoperative diagnoses as listed above.  Please refer to preoperative notes for more details. Patient was counseled regarding need for laparoscopic salpingectomy. Risks of surgery including bleeding which may require transfusion or reoperation, infection, injury to bowel or other surrounding organs, need for additional procedures including laparotomy and other postoperative/anesthesia complications were explained to patient.  Written informed consent was obtained.  FINDINGS:  25 cc amount of hemoperitoneum estimated to be about 25 cc of blood and clots.  Dilated right fallopian tube containing ectopic gestation. Small normal appearing uterus, normal left fallopian tube, right ovary and left ovary.  ANESTHESIA: General INTRAVENOUS FLUIDS: As recorded  ESTIMATED BLOOD LOSS: 25 cc URINE OUTPUT: As recorded SPECIMENS: Right fallopian tube containing ectopic gestation COMPLICATIONS: None immediate  PROCEDURE IN DETAIL:  The patient was taken to the operating room where general anesthesia was administered and was found to be adequate.  She was placed in the dorsal lithotomy position, and was prepped and draped in a sterile manner.  A Foley catheter was inserted into her bladder and attached to constant drainage and a uterine manipulator was then advanced into the uterus .    After an adequate timeout was performed, attention was turned to the abdomen where an umbilical incision was made with the scalpel. Fascia was exposed with S retractors and grasped with Kocher  clamps. Fascia was cut with Mayo . Peritoneum was grasped with tonsil clamp and cut with Metzenbaum . The connors of the Fascia were then secured with 0 Vicryl.  A 12 -mm Excel Hessian trochar with sleeve was then placed without difficulty and secured with connors Fascia sutures.    The abdomen was then insufflated with carbon dioxide gas and adequate pneumoperitoneum was obtained.  A survey of the patient's pelvis and abdomen revealed the findings above.  One 5-mm One Step and one 10 mm One Step porst were then placed under direct visualization in the left lower quad. After small stab incisions were made for placement.  The Nezhat suction irrigator was then used to suction the hemoperitoneum and irrigate the pelvis.  Attention was then turned to the right fallopian tube which was grasped and ligated from the underlying mesosalpinx and uterine attachment using the Harmonic instrument.  Good hemostasis was noted.  The specimen was placed in an EndoCatch bag and removed from the abdomen intact.  The abdomen was irrigated and blood clots removed. Inspection of all ligated sites revealed good hemostasis. The abdomen was desufflated, and all instruments were removed.  The fascial incision was closed with 0 Vicryl an interrupted suture of the same was used to closed the subcutaneous space.The umbilical incision was closed with 4-0 Monocryl and the lateral incisions were closed with 4-0 Monocryl as well. Each incision site was secured with Dermabond. The uterine manipulator and foley cathter was removed.  The patient tolerated the procedure well.  All instruments, needles, and sponge counts were correct x 2. The patient was taken to the recovery room in stable condition.   The patient will be discharged to home as per PACU criteria.  Routine postoperative instructions given.  She was prescribed Percocet & Ibuprofen.  She will follow up in the  clinic in about 3-4 weeks for postoperative evaluation.  Nettie ElmMichael Margalit Leece, MD,  FACOG Attending Obstetrician & Gynecologist Faculty Practice, [facility] [place]

## 2017-04-28 NOTE — Anesthesia Postprocedure Evaluation (Addendum)
Anesthesia Post Note  Patient: Janice Bradley  Procedure(s) Performed: Procedure(s) (LRB): DIAGNOSTIC LAPAROSCOPY WITH RIGHT SALPINGECTOMY AND REMOVAL OF ECTOPIC PREGNANCY (Right)  Patient location during evaluation: PACU Anesthesia Type: General Level of consciousness: awake Pain management: pain level controlled Vital Signs Assessment: post-procedure vital signs reviewed and stable Respiratory status: spontaneous breathing Cardiovascular status: stable Postop Assessment: no signs of nausea or vomiting Anesthetic complications: no        Last Vitals:  Vitals:   04/28/17 0225 04/28/17 0230  BP: 128/80 128/76  Pulse:  97  Resp:  16  Temp: 36.4 C     Last Pain:  Vitals:   04/28/17 0245  TempSrc:   PainSc: 7    Pain Goal: Patients Stated Pain Goal: 0 (04/28/17 0245)               Xian Alves JR,JOHN Susann GivensFRANKLIN

## 2017-04-28 NOTE — Discharge Instructions (Addendum)
Laparoscopic Tubal Removal for Ectopic Pregnancy Discharge Instructions Laparoscopic tubal removal is a procedure that removes the fallopian tube containing the ectopic pregnancy. RISKS AND COMPLICATIONS   Infection.  Bleeding.  Injury to surrounding organs.  Anesthetic side effects.  Failure of the procedure.  Risks of future ectopic pregnancy on the other side PROCEDURE   You may be given a medicine to help you relax (sedative) before the procedure. You will be given a medicine to make you sleep (general anesthetic) during the procedure.  A tube will be put down your throat to help your breath while under general anesthesia.  Two small cuts (incisions) are made in the lower abdominal area and one incision is made near the belly button.  Your abdominal area will be inflated with a safe gas (carbon dioxide). This helps give the surgeon room to operate, visualize, and helps the surgeon avoid other organs.  A thin, lighted tube (laparoscope) with a camera attached is inserted into your abdomen through the incision near the belly button. Other small instruments are also inserted through the other abdominal incisions.  The fallopian tube is located and are removed.  After the fallopian tube is removed, the gas is released from the abdomen.  The incisions will be closed with stitches (sutures), and Dermabond. A bandage may be placed over the incisions. AFTER THE PROCEDURE   You will also have some mild abdominal discomfort for 3-7 days. You will be given pain medicine to ease any discomfort.  As long as there are no problems, you may be allowed to go home. Someone will need to drive you home and be with you for at least 24 hours once home.  You may have some mild discomfort in the throat. This is from the tube placed in your throat while you were sleeping.  You may experience discomfort in the shoulder area from some trapped air between the liver and diaphragm. This sensation is  normal and will slowly go away on its own. HOME CARE INSTRUCTIONS   Take all medicines as directed.  Only take over-the-counter or prescription medicines for pain, discomfort, or fever as directed by your caregiver.  Resume daily activities as directed.  Showers are preferred over baths.  You may resume sexual activities in 1 week or as directed.  Do not drive while taking narcotics. SEEK MEDICAL CARE IF: .  There is increasing abdominal pain.  You feel lightheaded or faint.  You have the chills.  You have an oral temperature above 102 F (38.9 C).  There is pus-like (purulent) drainage from any of the wounds.  You are unable to pass gas or have a bowel movement.  You feel sick to your stomach (nauseous) or throw up (vomit). MAKE SURE YOU:   Understand these instructions.  Will watch your condition.  Will get help right away if you are not doing well or get worse.  ExitCare Patient Information 2013 [place], [place].   Post Anesthesia Home Care Instructions  No ibuprofen products until: 8 am today  Activity: Get plenty of rest for the remainder of the day. A responsible individual must stay with you for 24 hours following the procedure.  For the next 24 hours, DO NOT: -Drive a car -Advertising copywriter -Drink alcoholic beverages -Take any medication unless instructed by your physician -Make any legal decisions or sign important papers.  Meals: Start with liquid foods such as gelatin or soup. Progress to regular foods as tolerated. Avoid greasy, spicy, heavy foods. If nausea and/or  vomiting occur, drink only clear liquids until the nausea and/or vomiting subsides. Call your physician if vomiting continues.  Special Instructions/Symptoms: Your throat may feel dry or sore from the anesthesia or the breathing tube placed in your throat during surgery. If this causes discomfort, gargle with warm salt water. The discomfort should disappear within 24 hours.  If you had  a scopolamine patch placed behind your ear for the management of post- operative nausea and/or vomiting:  1. The medication in the patch is effective for 72 hours, after which it should be removed.  Wrap patch in a tissue and discard in the trash. Wash hands thoroughly with soap and water. 2. You may remove the patch earlier than 72 hours if you experience unpleasant side effects which may include dry mouth, dizziness or visual disturbances. 3. Avoid touching the patch. Wash your hands with soap and water after contact with the patch.

## 2017-04-28 NOTE — Transfer of Care (Signed)
Immediate Anesthesia Transfer of Care Note  Patient: Janice Bradley  Procedure(s) Performed: Procedure(s): DIAGNOSTIC LAPAROSCOPY WITH RIGHT SALPINGECTOMY AND REMOVAL OF ECTOPIC PREGNANCY (Right)  Patient Location: PACU  Anesthesia Type:General  Level of Consciousness: awake, alert  and oriented  Airway & Oxygen Therapy: Patient Spontanous Breathing and Patient connected to nasal cannula oxygen  Post-op Assessment: Report given to RN and Post -op Vital signs reviewed and stable  Post vital signs: Reviewed and stable  Last Vitals:  Vitals:   04/27/17 1954 04/27/17 2223  BP: 116/79 124/83  Pulse: 74 75  Resp: 18   Temp: 36.9 C     Last Pain:  Vitals:   04/27/17 2222  TempSrc:   PainSc: 7          Complications: No apparent anesthesia complications

## 2017-05-01 ENCOUNTER — Encounter (HOSPITAL_COMMUNITY): Payer: Self-pay | Admitting: Obstetrics and Gynecology

## 2017-05-08 IMAGING — US US OB COMP LESS 14 WK
1 series · 13 of 28 positions shown · non-contrast
Comparison: None.

CLINICAL DATA: Right lower quadrant pelvic pain. Vaginal bleeding.
Gestational age of 7 weeks 6 days by last menstrual. Beta HCG of
404.

EXAM:
OBSTETRIC <14 WK US AND TRANSVAGINAL OB US
TECHNIQUE: Both transabdominal and transvaginal ultrasound examinations were
performed for complete evaluation of the gestation as well as the
maternal uterus, adnexal regions, and pelvic cul-de-sac.
Transvaginal technique was performed to assess early pregnancy.

[Series 1: us ob comp less 14 wk · 0.23mm/px · 91 acquisitions, 13 frames shown]
[im 4/91]
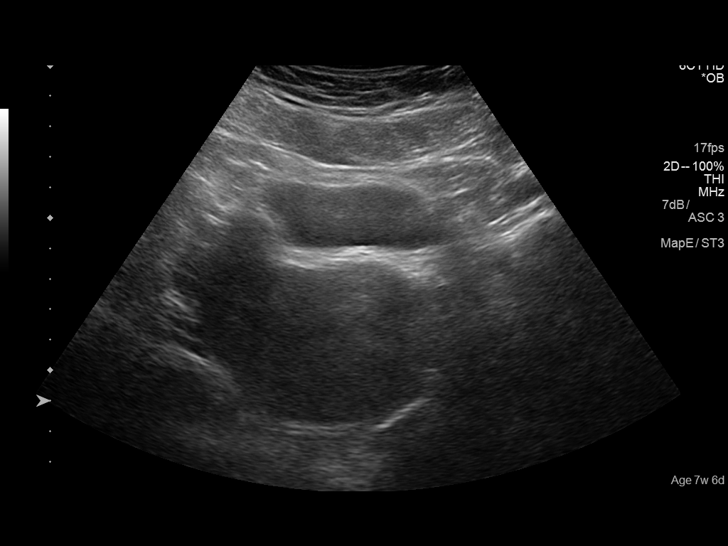
[im 11/91]
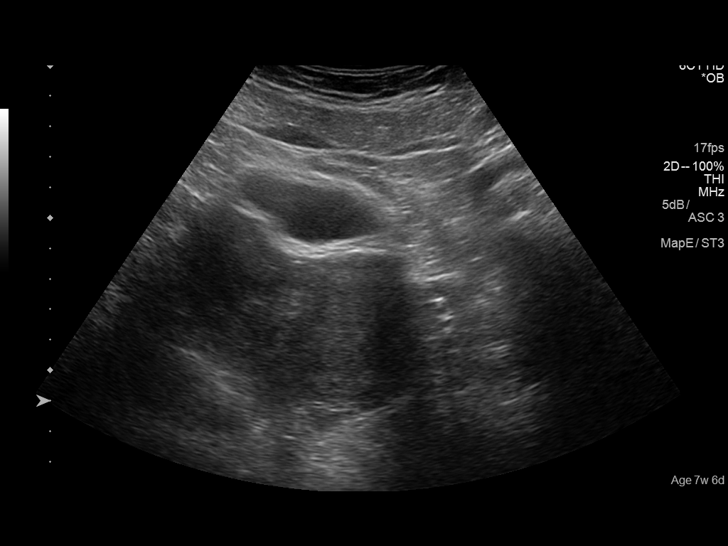
[im 17/91]
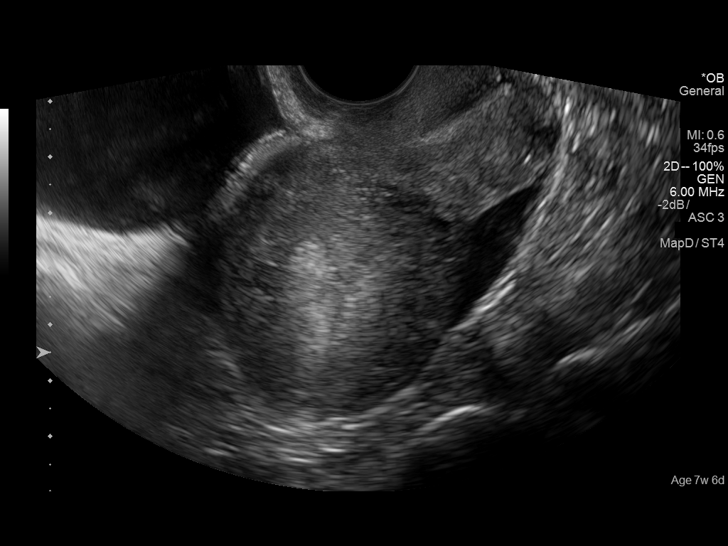
[im 24/91]
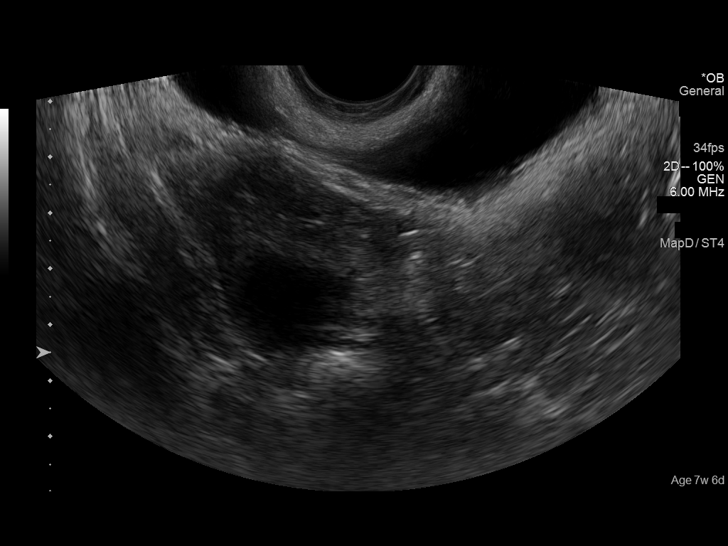
[im 31/91]
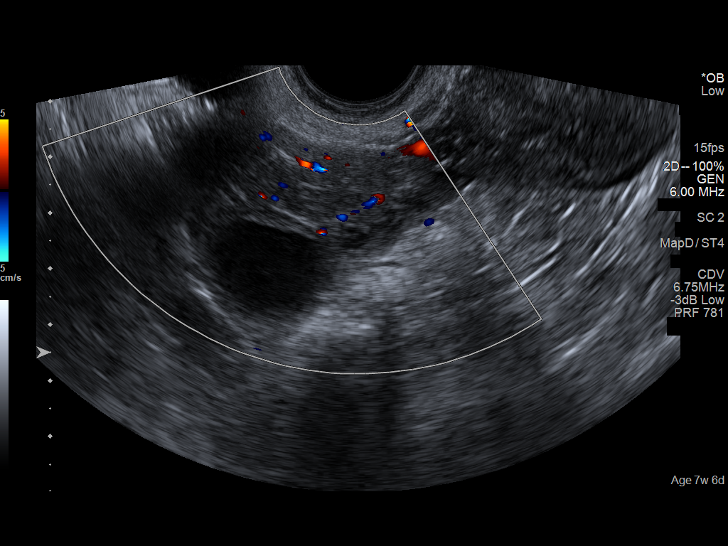
[im 37/91]
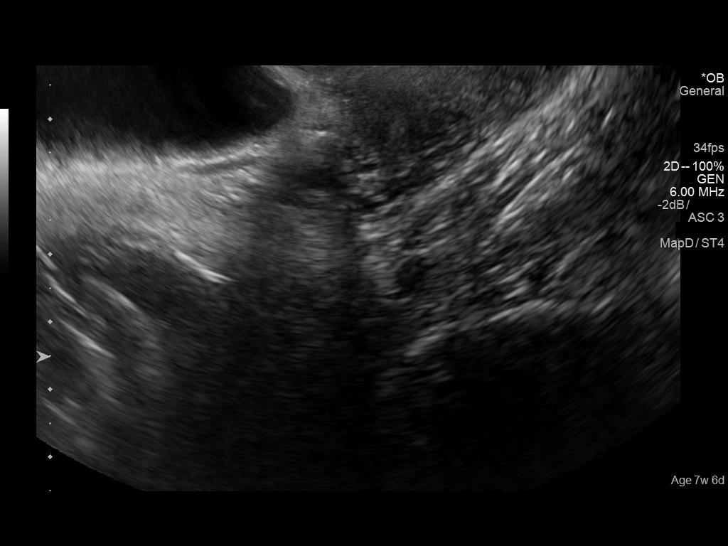
[im 47/91]
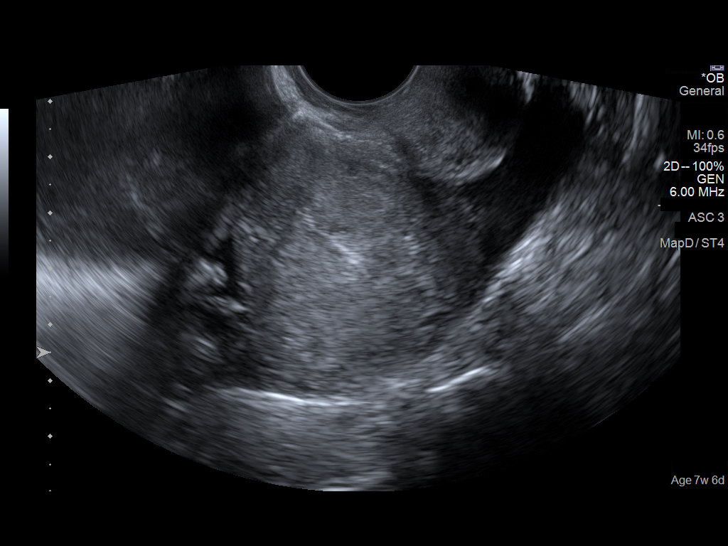
[im 54/91]
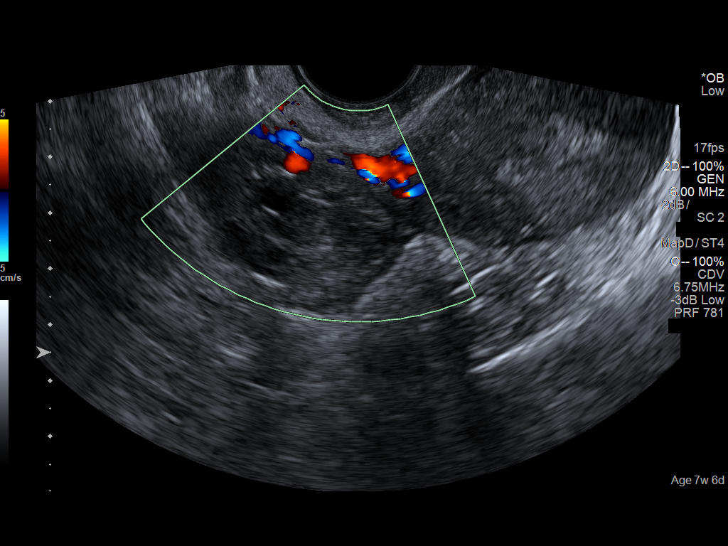
[im 61/91]
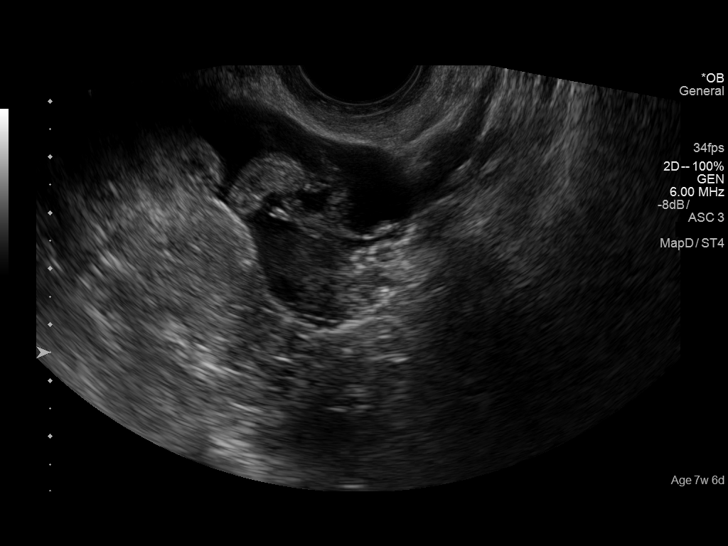
[im 67/91]
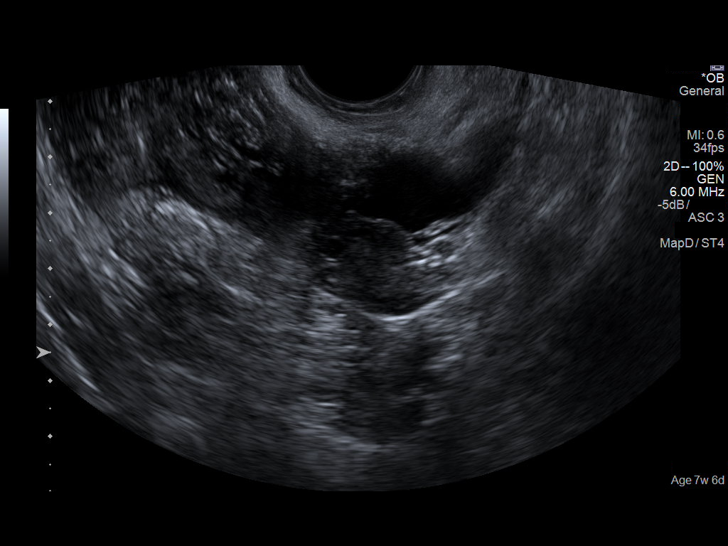
[im 74/91]
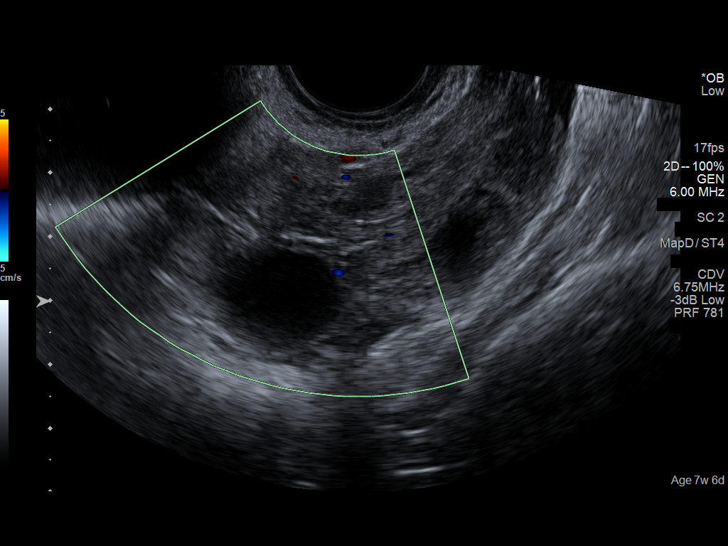
[im 81/91]
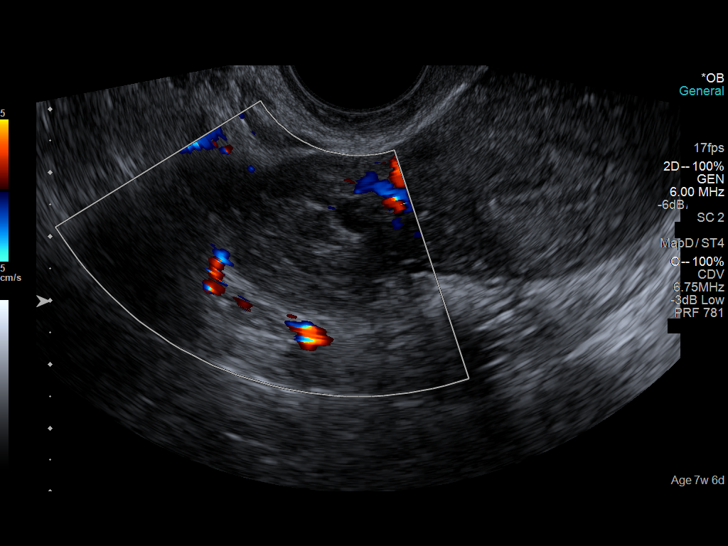
[im 87/91]
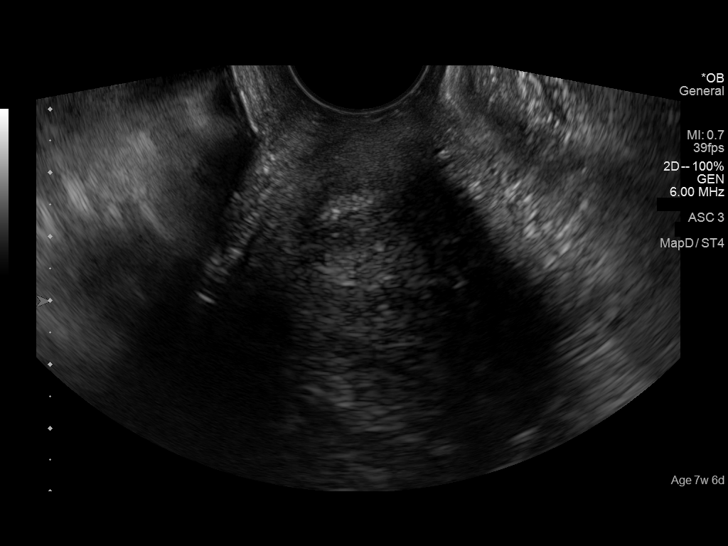

[13 of 28 positions shown; findings below may reference images not displayed]

FINDINGS: Intrauterine gestational sac: Absent

Yolk sac:  Absent

Embryo:  Absent

Endometrium is thickened at 16 mm.

Maternal uterus/adnexae: The left ovary measures 2.6 x 2.0 x 2.7 cm.
Normal in morphology.

Soft tissue echogenicity in the right adnexa of 4.8 x 2.0 x 4.2 cm
is favored to represent ovarian parenchyma. Within the right ovary
is a 1.7 x 1.4 x 2.1 cm simple appearing cyst.

No definite extra ovarian adnexal mass is seen. There is a moderate
amount of complex flu identified within the cul-de-sac, including on
image 47. Fluid is also identified adjacent the left ovary.
IMPRESSION: Lack of intrauterine gestational sac. Endometrial thickening with a
moderate amount of complex fluid in the cul-de-sac. Although
findings could relate to early intrauterine pregnancy (given the low
beta HCG level), complex fluid is suspicious and differential
considerations include ectopic pregnancy or missed abortion.
Consider short term beta HCG follow-up and possibly ultrasound.

## 2017-06-02 NOTE — Addendum Note (Signed)
Addendum  created 06/02/17 1227 by Yobani Schertzer, MD   Sign clinical note    

## 2017-10-21 IMAGING — US US OB TRANSVAGINAL
1 series · 15 of 28 positions shown · non-contrast
Comparison: Pelvic ultrasound performed 06/03/2016

CLINICAL DATA: Acute onset of right lower quadrant abdominal pain.
Continued rise in quantitative beta HCG. Subsequent encounter.

EXAM:
TRANSVAGINAL OB ULTRASOUND
TECHNIQUE: Transvaginal ultrasound was performed for complete evaluation of the
gestation as well as the maternal uterus, adnexal regions, and
pelvic cul-de-sac.

[Series 1: us ob transvaginal · 15 of 53 slices shown]
[im 1/53]
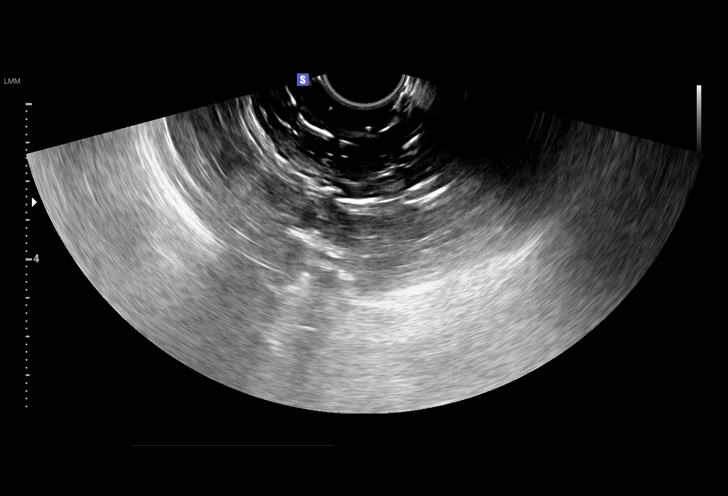
[im 4/53]
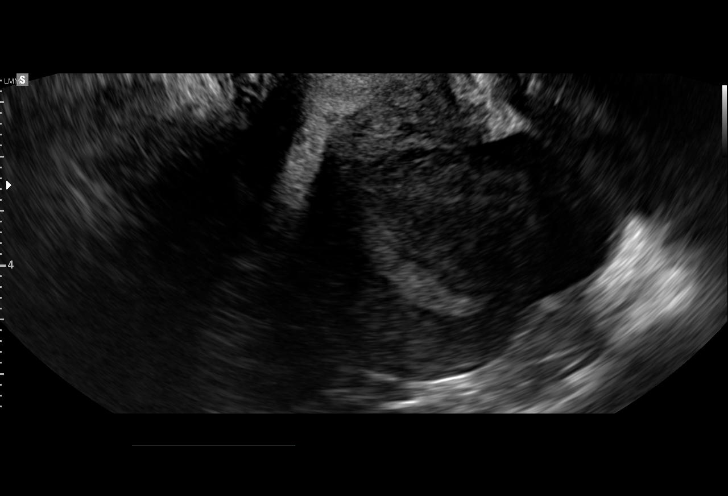
[im 8/53]
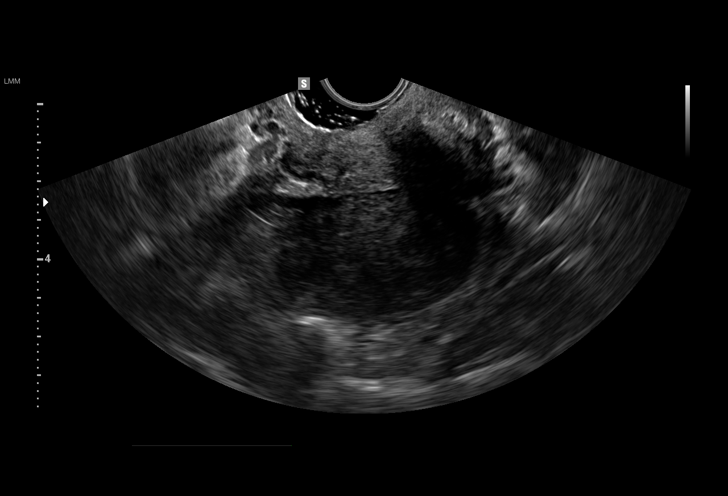
[im 12/53]
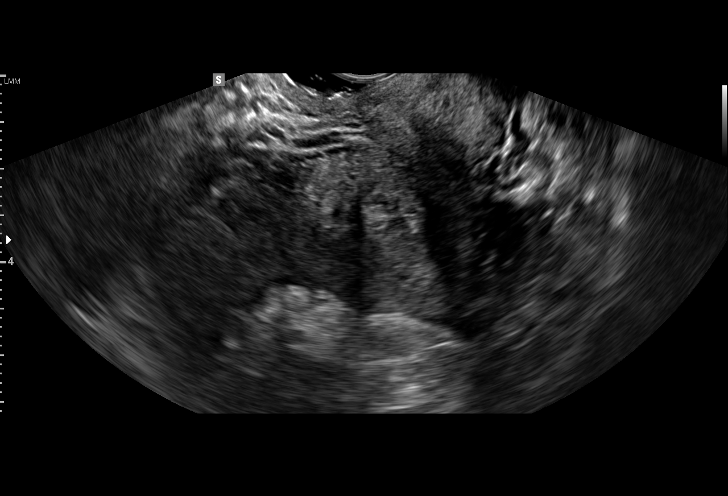
[im 16/53]
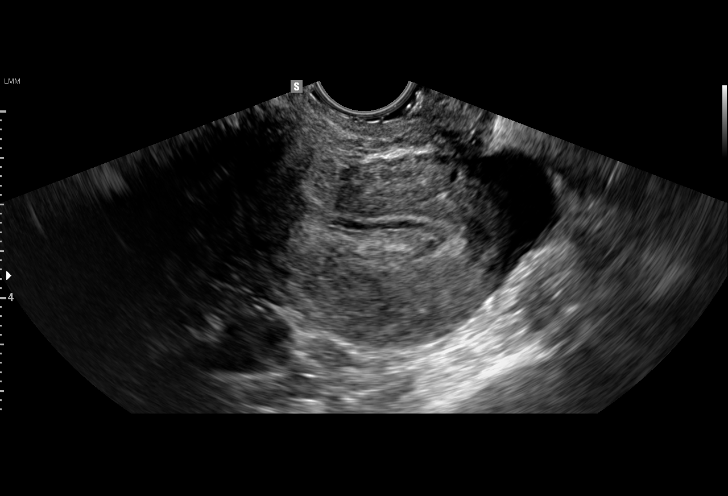
[im 20/53]
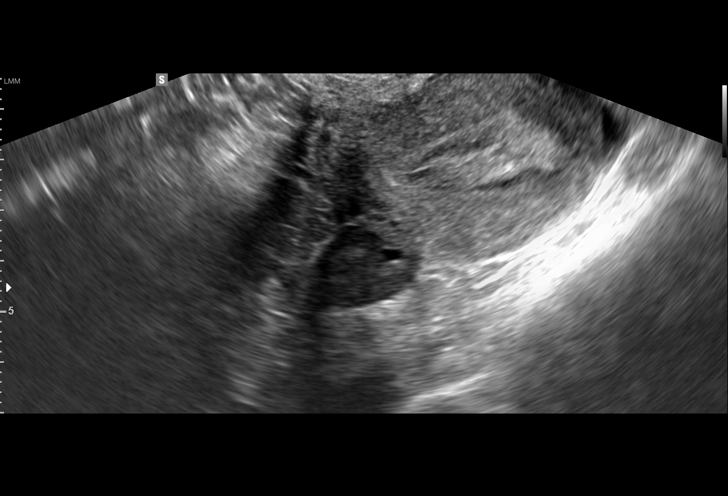
[im 24/53]
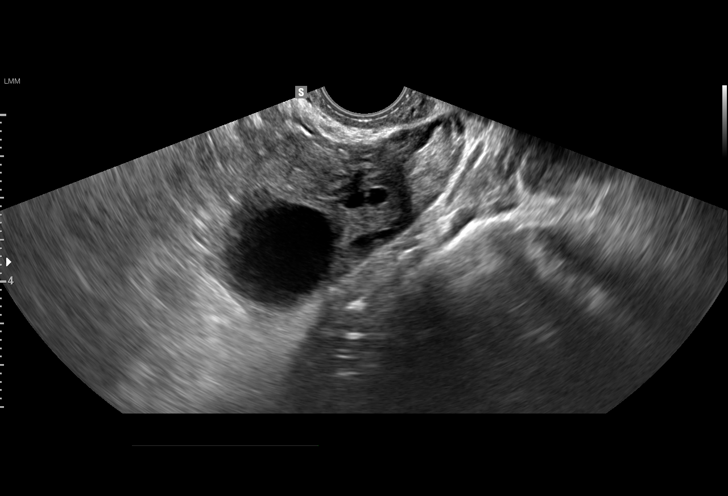
[im 27/53]
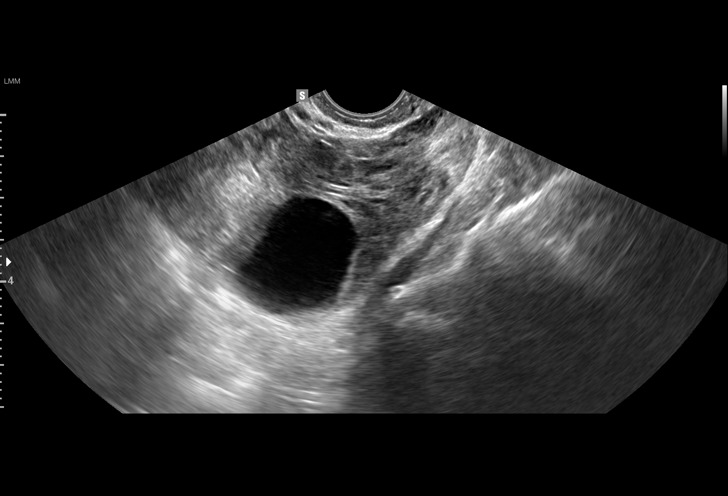
[im 29/53]
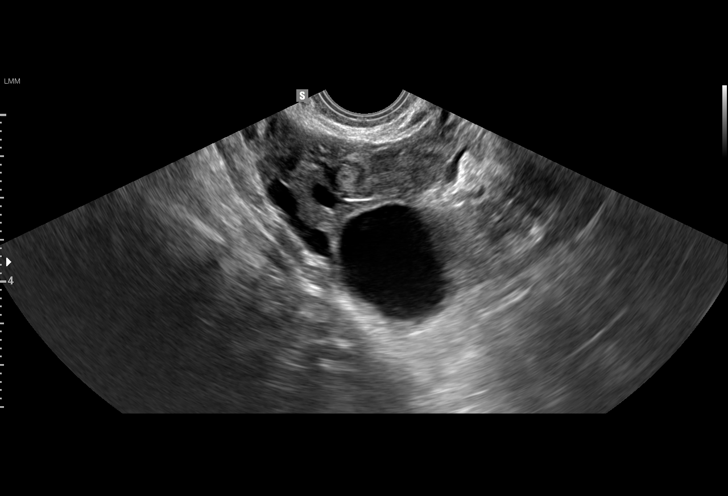
[im 33/53]
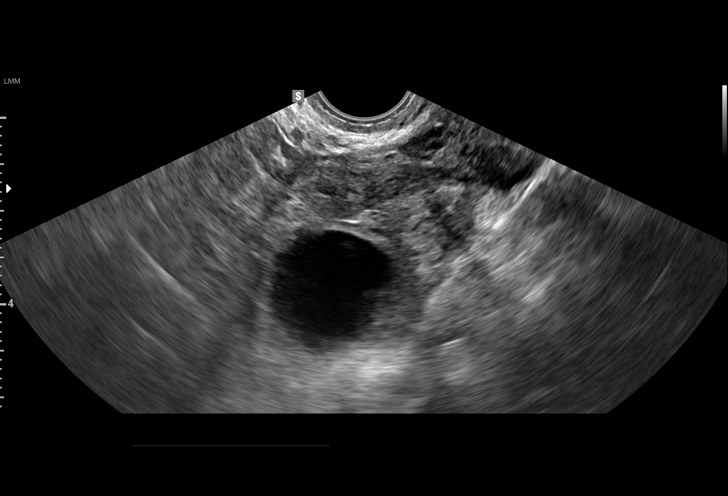
[im 37/53]
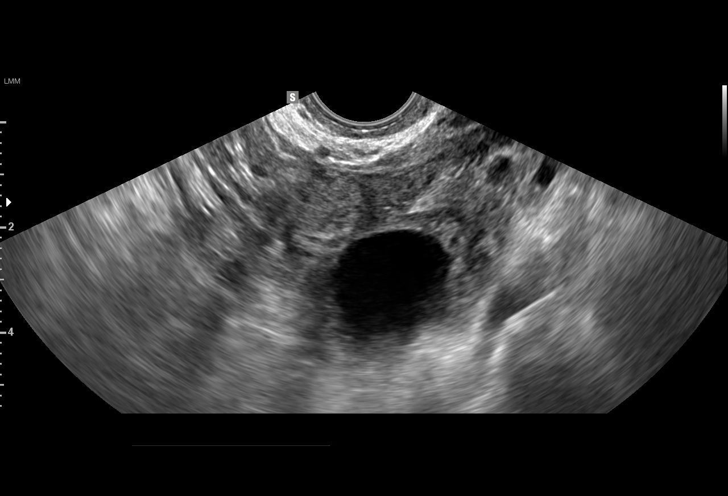
[im 41/53]
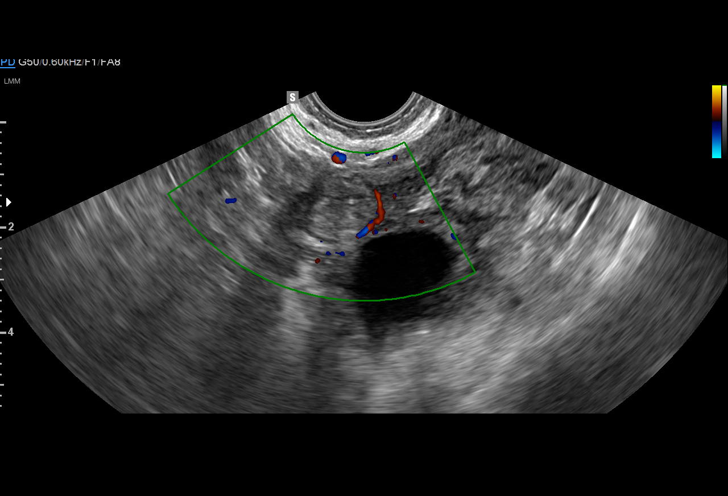
[im 45/53]
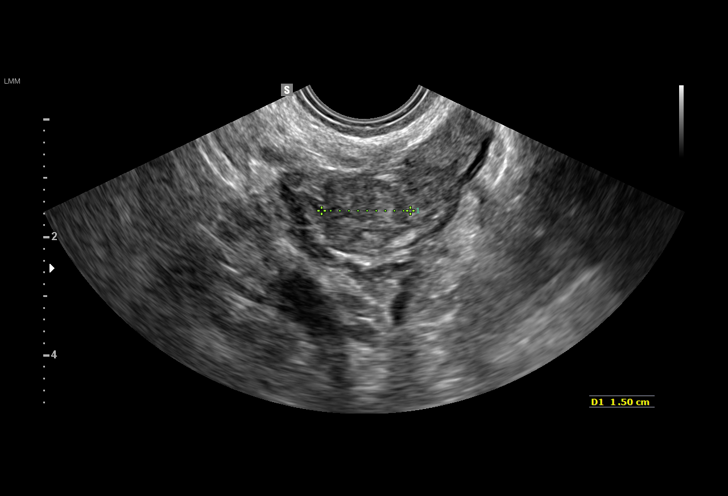
[im 49/53]
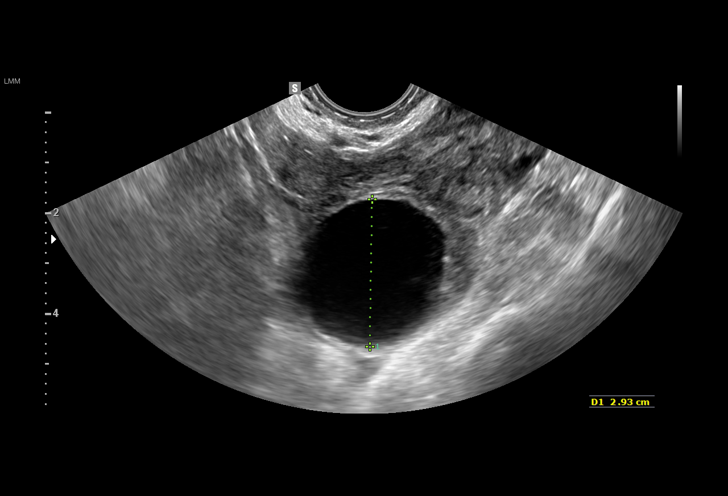
[im 53/53]
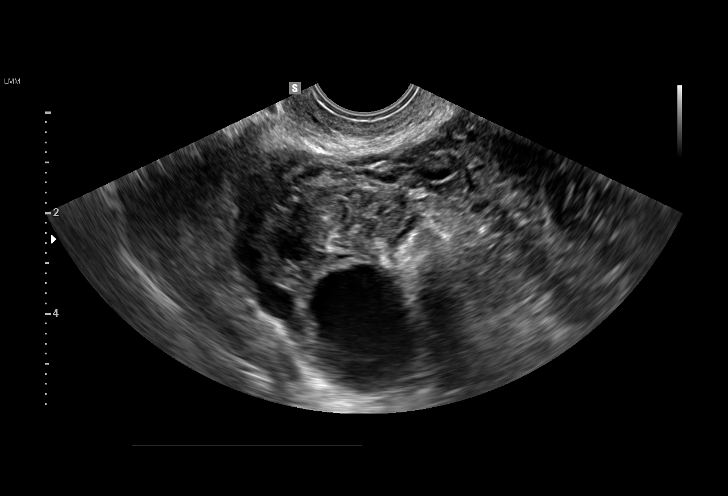

[15 of 28 positions shown; findings below may reference images not displayed]

FINDINGS: Intrauterine gestational sac: None seen.

Yolk sac:  N/A

Embryo:  N/A

Subchorionic hemorrhage:  None visualized.

Maternal uterus/adnexae: Trace fluid is again noted within the
endometrial canal. The uterus is otherwise unremarkable.

There is a 3.3 x 2.6 x 1.6 cm complex soft tissue mass noted at the
right adnexa, anterior to the right ovary, with mild peripheral
blood flow on color Doppler evaluation. Associated focal tenderness
is noted. This is suspicious for a small ectopic pregnancy.

The ovaries are otherwise grossly unremarkable in appearance. There
is no evidence for ovarian torsion.

A small amount of complex free fluid is seen within the pelvic
cul-de-sac.
IMPRESSION: 1. 3.3 x 2.6 x 1.6 cm complex soft tissue mass at the right adnexa,
anterior to the right ovary, with mild peripheral blood flow on
color Doppler evaluation. Associated focal tenderness noted. This
most likely reflects a small ectopic pregnancy.
2. No evidence of intrauterine gestational sac at this time, though
it remains too early to assess for intrauterine pregnancy given the
quantitative beta HCG level of 568.
3. Trace free fluid again noted within the endometrial canal.
These results were called by telephone at the time of interpretation
on 06/05/2016 at [DATE] to ELOIE PLATTER NP, who verbally
acknowledged these results.

## 2018-01-26 ENCOUNTER — Encounter: Payer: Self-pay | Admitting: Primary Care

## 2018-01-26 ENCOUNTER — Ambulatory Visit (INDEPENDENT_AMBULATORY_CARE_PROVIDER_SITE_OTHER): Payer: 59 | Admitting: Primary Care

## 2018-01-26 VITALS — BP 120/72 | HR 84 | Temp 98.2°F | Ht 65.75 in | Wt 167.5 lb

## 2018-01-26 DIAGNOSIS — Z01419 Encounter for gynecological examination (general) (routine) without abnormal findings: Secondary | ICD-10-CM

## 2018-01-26 DIAGNOSIS — Z0000 Encounter for general adult medical examination without abnormal findings: Secondary | ICD-10-CM | POA: Insufficient documentation

## 2018-01-26 DIAGNOSIS — Z23 Encounter for immunization: Secondary | ICD-10-CM | POA: Diagnosis not present

## 2018-01-26 LAB — COMPREHENSIVE METABOLIC PANEL
ALT: 14 U/L (ref 0–35)
AST: 15 U/L (ref 0–37)
Albumin: 4.2 g/dL (ref 3.5–5.2)
Alkaline Phosphatase: 72 U/L (ref 39–117)
BILIRUBIN TOTAL: 0.3 mg/dL (ref 0.2–1.2)
BUN: 11 mg/dL (ref 6–23)
CALCIUM: 9 mg/dL (ref 8.4–10.5)
CHLORIDE: 104 meq/L (ref 96–112)
CO2: 27 meq/L (ref 19–32)
Creatinine, Ser: 0.67 mg/dL (ref 0.40–1.20)
GFR: 106.01 mL/min (ref >60.00)
GLUCOSE: 91 mg/dL (ref 70–99)
Potassium: 4 mEq/L (ref 3.5–5.1)
SODIUM: 138 meq/L (ref 135–145)
Total Protein: 7.3 g/dL (ref 6.0–8.3)

## 2018-01-26 LAB — LIPID PANEL
Cholesterol: 151 mg/dL (ref 0–200)
HDL: 45.3 mg/dL (ref >39.00)
LDL CALC: 92 mg/dL (ref 0–99)
NONHDL: 106.14
Total CHOL/HDL Ratio: 3
Triglycerides: 70 mg/dL (ref 0.0–149.0)
VLDL: 14 mg/dL (ref 0.0–40.0)

## 2018-01-26 LAB — TSH: TSH: 0.97 u[IU]/mL (ref 0.35–4.50)

## 2018-01-26 NOTE — Addendum Note (Signed)
Addended by: Tawnya CrookSAMBATH, Danney Bungert on: 01/26/2018 04:27 PM   Modules accepted: Orders

## 2018-01-26 NOTE — Patient Instructions (Signed)
Stop by the lab prior to leaving today. I will notify you of your results once received.   You will be contacted regarding your referral to OB/GYN.  Please let us know if you have not been contacted within one week.   Start exercising. You should be getting 150 minutes of moderate intensity exercise weekly.  It's important to improve your diet by reducing consumption of fast food, fried food, processed snack foods. Increase consumption of fresh vegetables and fruits, whole grains, water.  Ensure you are drinking 64 ounces of water daily.  Follow up in 1 year for your annual exam or sooner if needed.  It was a pleasure to meet you today! Please don't hesitate to call or message me with any questions. Welcome to [facility]!   Preventive Care 18-39 Years, Female Preventive care refers to lifestyle choices and visits with your health care provider that can promote health and wellness. What does preventive care include?  A yearly physical exam. This is also called an annual well check.  Dental exams once or twice a year.  Routine eye exams. Ask your health care provider how often you should have your eyes checked.  Personal lifestyle choices, including: ? Daily care of your teeth and gums. ? Regular physical activity. ? Eating a healthy diet. ? Avoiding tobacco and drug use. ? Limiting alcohol use. ? Practicing safe sex. ? Taking vitamin and mineral supplements as recommended by your health care provider. What happens during an annual well check? The services and screenings done by your health care provider during your annual well check will depend on your age, overall health, lifestyle risk factors, and family history of disease. Counseling Your health care provider may ask you questions about your:  Alcohol use.  Tobacco use.  Drug use.  Emotional well-being.  Home and relationship well-being.  Sexual activity.  Eating habits.  Work and work Statistician.  Method of  birth control.  Menstrual cycle.  Pregnancy history.  Screening You may have the following tests or measurements:  Height, weight, and BMI.  Diabetes screening. This is done by checking your blood sugar (glucose) after you have not eaten for a while (fasting).  Blood pressure.  Lipid and cholesterol levels. These may be checked every 5 years starting at age 32.  Skin check.  Hepatitis C blood test.  Hepatitis B blood test.  Sexually transmitted disease (STD) testing.  BRCA-related cancer screening. This may be done if you have a family history of breast, ovarian, tubal, or peritoneal cancers.  Pelvic exam and Pap test. This may be done every 3 years starting at age 43. Starting at age 71, this may be done every 5 years if you have a Pap test in combination with an HPV test.  Discuss your test results, treatment options, and if necessary, the need for more tests with your health care provider. Vaccines Your health care provider may recommend certain vaccines, such as:  Influenza vaccine. This is recommended every year.  Tetanus, diphtheria, and acellular pertussis (Tdap, Td) vaccine. You may need a Td booster every 10 years.  Varicella vaccine. You may need this if you have not been vaccinated.  HPV vaccine. If you are 26 or younger, you may need three doses over 6 months.  Measles, mumps, and rubella (MMR) vaccine. You may need at least one dose of MMR. You may also need a second dose.  Pneumococcal 13-valent conjugate (PCV13) vaccine. You may need this if you have certain conditions and were  not previously vaccinated.  Pneumococcal polysaccharide (PPSV23) vaccine. You may need one or two doses if you smoke cigarettes or if you have certain conditions.  Meningococcal vaccine. One dose is recommended if you are age 32-21 years and a first-year college student living in a residence hall, or if you have one of several medical conditions. You may also need additional  booster doses.  Hepatitis A vaccine. You may need this if you have certain conditions or if you travel or work in places where you may be exposed to hepatitis A.  Hepatitis B vaccine. You may need this if you have certain conditions or if you travel or work in places where you may be exposed to hepatitis B.  Haemophilus influenzae type b (Hib) vaccine. You may need this if you have certain risk factors.  Talk to your health care provider about which screenings and vaccines you need and how often you need them. This information is not intended to replace advice given to you by your health care provider. Make sure you discuss any questions you have with your health care provider. Document Released: 02/07/2002 Document Revised: 08/31/2016 Document Reviewed: 10/13/2015 Elsevier Interactive Patient Education  [employer].

## 2018-01-26 NOTE — Assessment & Plan Note (Signed)
Td UTD, influenza vaccination provided today. Pap smear due, she'd prefer to establish with GYN. Referral placed also for fertility evaluation. Discussed the importance of a healthy diet and regular exercise in order for weight loss, and to reduce the risk of any potential medical problems. Exam unremarkable. Labs pending. Follow up in 1 year.

## 2018-01-26 NOTE — Progress Notes (Signed)
Subjective:    Patient ID: Janice Bradley, female    DOB: July 11, 1981, 37 y.o.   MRN: [id number]  HPI  Janice Bradley is a 37 year old female who presents today to establish care and for complete physical.  She would like a referral to an OB/GYN for fertility issues. She has a history of ectopic pregnancy x 2 and is wanting to conceive.   Immunizations: -Tetanus: Completed in 2013 -Influenza: Due today.   Diet: She endorses a fair diet. Breakfast: Smoothies, fruit, fast food Lunch: Salad, fast food, pasta Dinner: Meat, vegetable, starch, take out food Snacks: Occasionally, nuts Desserts: 2 -3 times weekly Beverages: Water, cutting back on soda  Exercise: She does not currently exercise Eye exam: Completed 2 years ago Dental exam: No recent exam Pap Smear: Completed around 2011-2012    Review of Systems  Constitutional: Negative for unexpected weight change.  HENT: Negative for rhinorrhea.   Respiratory: Negative for cough and shortness of breath.   Cardiovascular: Negative for chest pain.  Gastrointestinal: Negative for constipation and diarrhea.  Genitourinary: Negative for difficulty urinating and menstrual problem.  Musculoskeletal: Negative for arthralgias and myalgias.  Skin: Negative for rash.  Allergic/Immunologic: Negative for environmental allergies.  Neurological: Negative for dizziness, numbness and headaches.  Psychiatric/Behavioral: The patient is not nervous/anxious.        Past Medical History:  Diagnosis Date  . Chickenpox   . Recurrent UTI      Social History   Socioeconomic History  . Marital status: Married    Spouse name: Not on file  . Number of children: Not on file  . Years of education: Not on file  . Highest education level: Not on file  Social Needs  . Financial resource strain: Not on file  . Food insecurity - worry: Not on file  . Food insecurity - inability: Not on file  . Transportation needs - medical: Not on file  .  Transportation needs - non-medical: Not on file  Occupational History  . Not on file  Tobacco Use  . Smoking status: Former Games developer  . Smokeless tobacco: Never Used  Substance and Sexual Activity  . Alcohol use: Yes    Comment: 1-2 glasses a wine a month  . Drug use: Yes    Types: Marijuana    Comment: 3 weeks ago last use  . Sexual activity: Yes    Birth control/protection: None  Other Topics Concern  . Not on file  Social History Narrative   Married.   No children.   Works as a Engineer, production.    Enjoys hiking, relaxing, cross stitching     Past Surgical History:  Procedure Laterality Date  . DIAGNOSTIC LAPAROSCOPY WITH REMOVAL OF ECTOPIC PREGNANCY Right 04/28/2017   Procedure: DIAGNOSTIC LAPAROSCOPY WITH RIGHT SALPINGECTOMY AND REMOVAL OF ECTOPIC PREGNANCY;  Surgeon: Hermina Staggers, MD;  Location: [facility];  Service: Gynecology;  Laterality: Right;    Family History  Problem Relation Age of Onset  . Hyperlipidemia Mother   . Hypertension Mother   . Skin cancer Mother   . Hyperlipidemia Father   . Hypertension Father     No Known Allergies  No current outpatient medications on file prior to visit.   No current facility-administered medications on file prior to visit.     BP 120/72   Pulse 84   Temp 98.2 F (36.8 C) (Oral)   Ht 5' 5.75" (1.67 m)   Wt 167 lb 8 oz (76 kg)  LMP 12/29/2017   SpO2 98%   Breastfeeding? Unknown   BMI 27.24 kg/m    Objective:   Physical Exam  Constitutional: She is oriented to person, place, and time. She appears well-nourished.  HENT:  Right Ear: Tympanic membrane and ear canal normal.  Left Ear: Tympanic membrane and ear canal normal.  Nose: Nose normal.  Mouth/Throat: Oropharynx is clear and moist.  Eyes: Conjunctivae and EOM are normal. Pupils are equal, round, and reactive to light.  Neck: Neck supple. No thyromegaly present.  Cardiovascular: Normal rate and regular rhythm.  No murmur heard. Pulmonary/Chest:  Effort normal and breath sounds normal. She has no rales.  Abdominal: Soft. Bowel sounds are normal. There is no tenderness.  Musculoskeletal: Normal range of motion.  Lymphadenopathy:    She has no cervical adenopathy.  Neurological: She is alert and oriented to person, place, and time. She has normal reflexes. No cranial nerve deficit.  Skin: Skin is warm and dry. No rash noted.  Psychiatric: She has a normal mood and affect.          Assessment & Plan:

## 2018-01-29 ENCOUNTER — Encounter: Payer: Self-pay | Admitting: *Deleted

## 2018-01-30 ENCOUNTER — Telehealth: Payer: Self-pay | Admitting: Radiology

## 2018-01-30 NOTE — Telephone Encounter (Signed)
Left message on the cell phone voicemail to call [id number] to schedule appointment from referral.  

## 2018-03-19 NOTE — Progress Notes (Deleted)
Last pap

## 2018-03-20 ENCOUNTER — Encounter: Payer: 59 | Admitting: Obstetrics & Gynecology

## 2018-03-20 DIAGNOSIS — Z01419 Encounter for gynecological examination (general) (routine) without abnormal findings: Secondary | ICD-10-CM

## 2018-04-16 ENCOUNTER — Telehealth: Payer: Self-pay

## 2018-04-16 NOTE — Telephone Encounter (Signed)
Left message for patient to call [name] back in regards to a referral-[name] [name], RMA   

## 2018-04-20 NOTE — Telephone Encounter (Signed)
To close encounter

## 2018-09-06 IMAGING — US US OB COMP LESS 14 WK
1 series · 15 of 28 positions shown · non-contrast
Comparison: 06/05/2016

CLINICAL DATA: Bleeding with pelvic pain

EXAM:
OBSTETRIC <14 WK US AND TRANSVAGINAL OB US
TECHNIQUE: Both transabdominal and transvaginal ultrasound examinations were
performed for complete evaluation of the gestation as well as the
maternal uterus, adnexal regions, and pelvic cul-de-sac.
Transvaginal technique was performed to assess early pregnancy.

[Series 1: us ob comp less 14 wk · 15 of 65 slices shown]
[im 1/65]
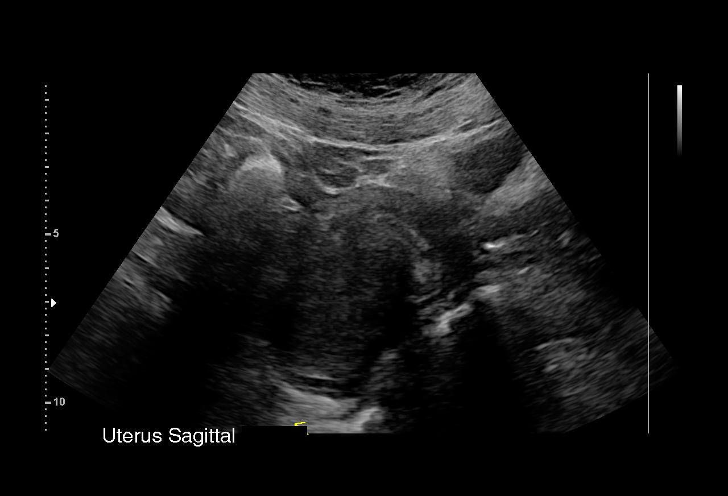
[im 5/65]
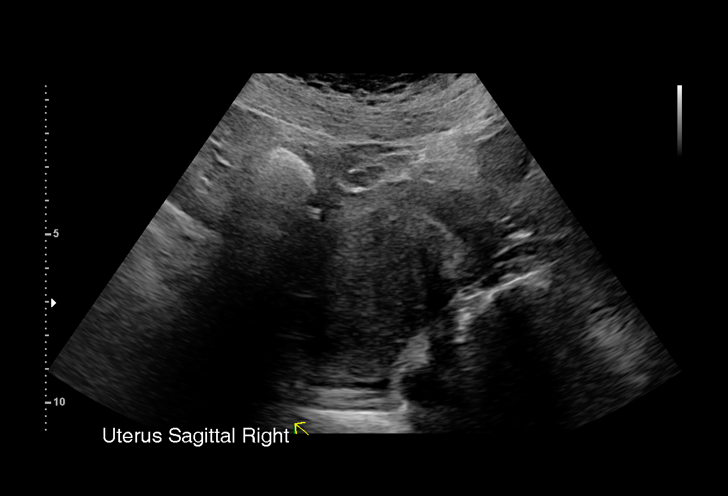
[im 10/65]
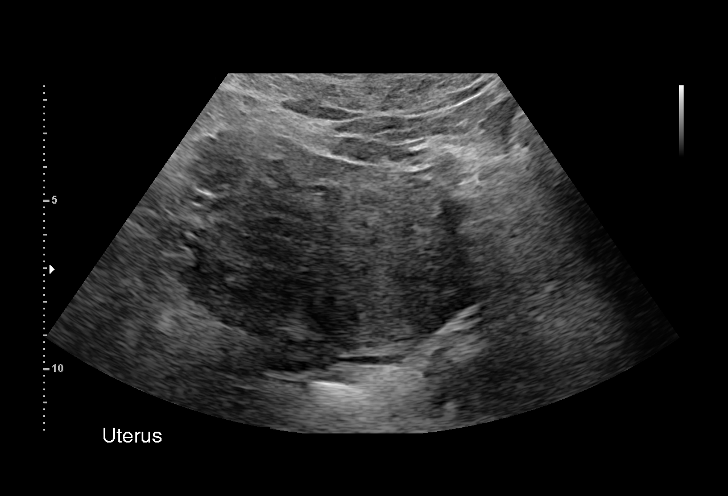
[im 15/65]
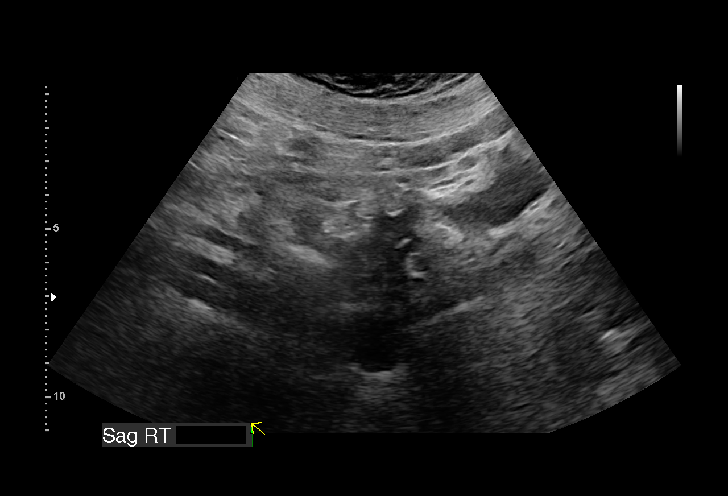
[im 19/65]
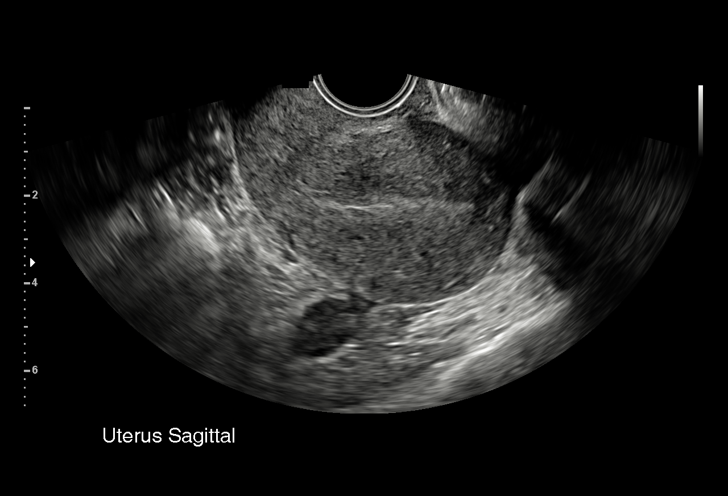
[im 24/65]
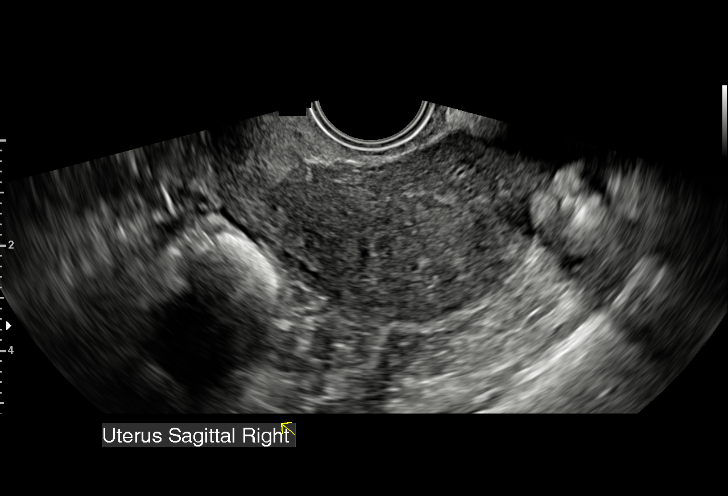
[im 29/65]
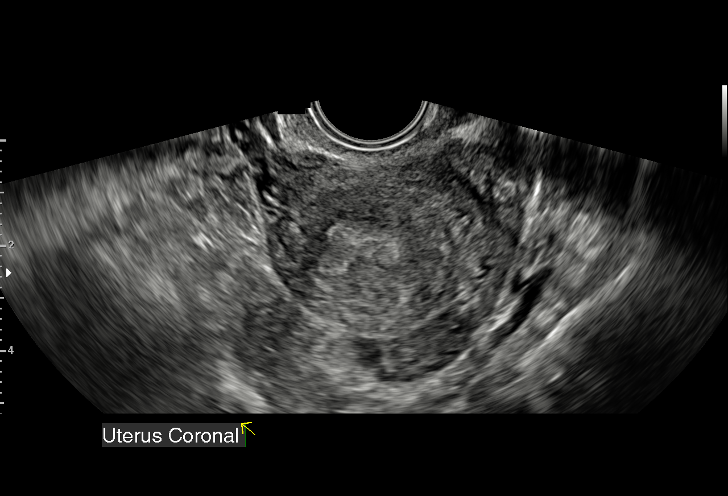
[im 34/65]
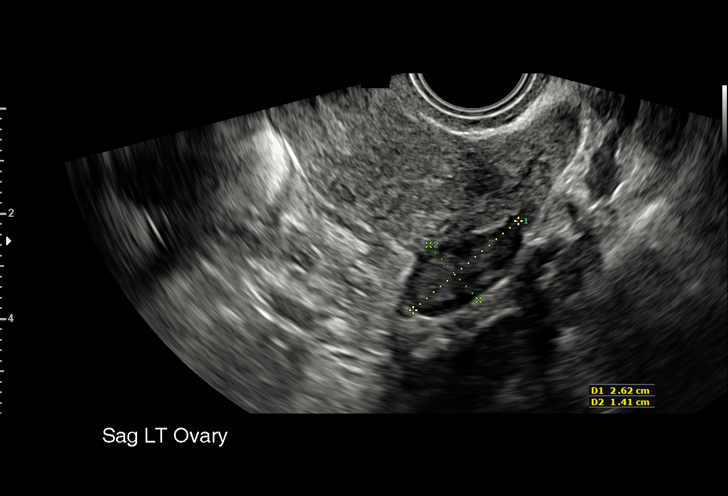
[im 36/65]
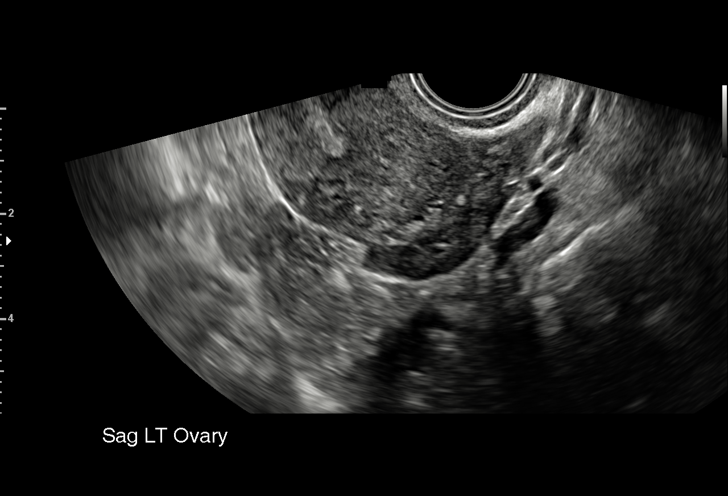
[im 41/65]
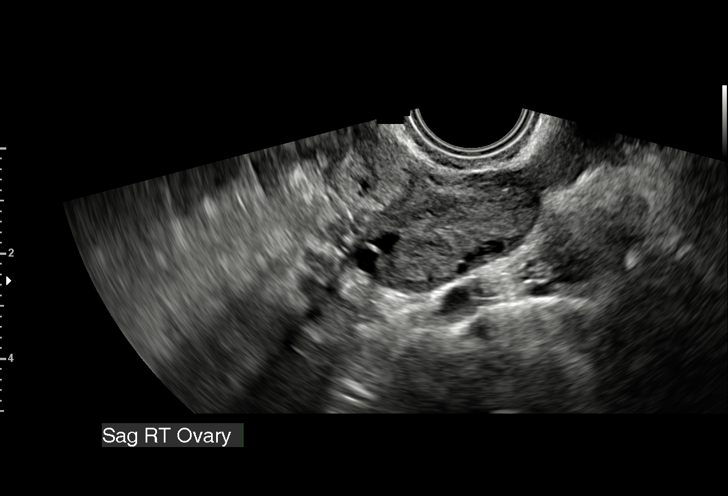
[im 46/65]
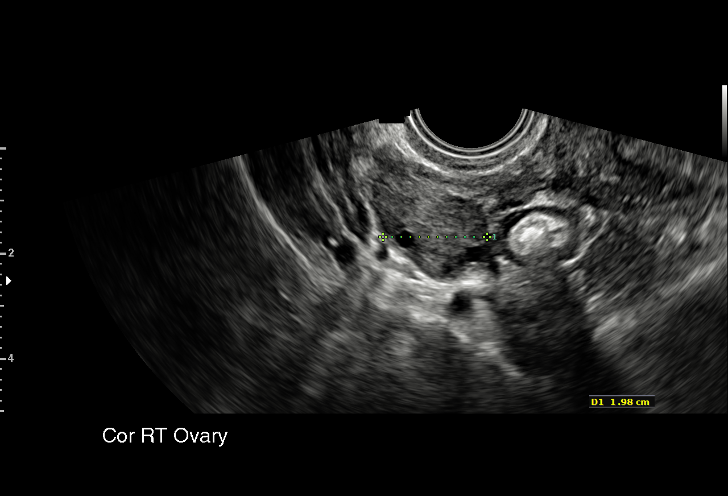
[im 50/65]
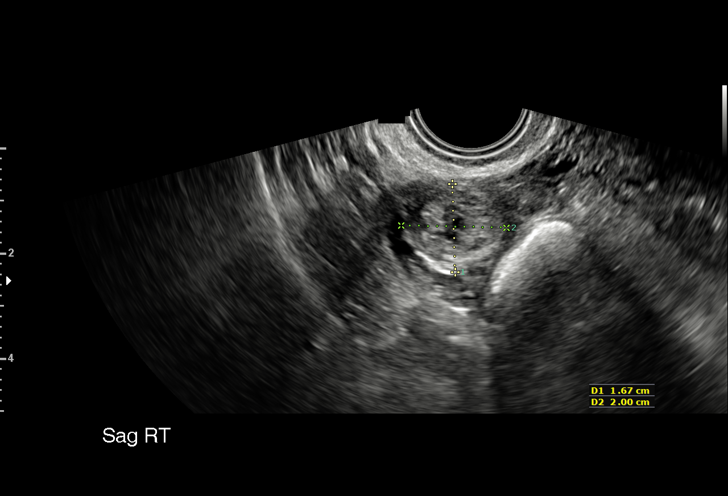
[im 55/65]
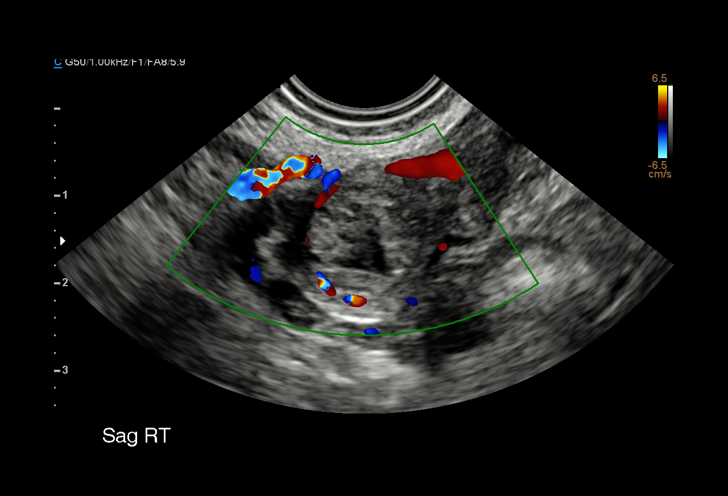
[im 60/65]
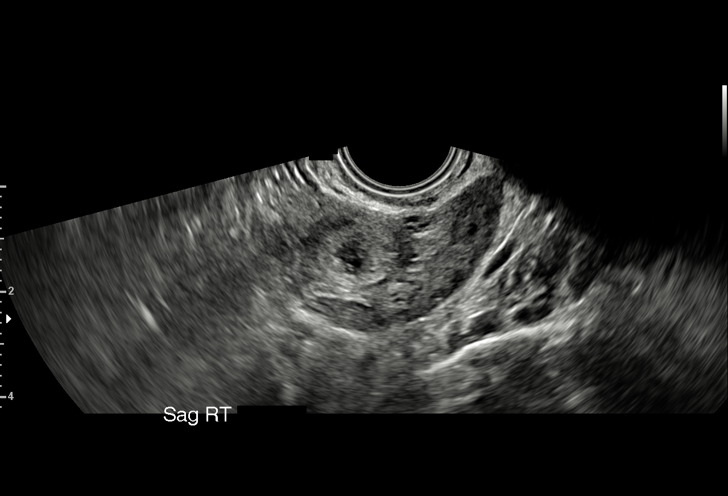
[im 65/65]
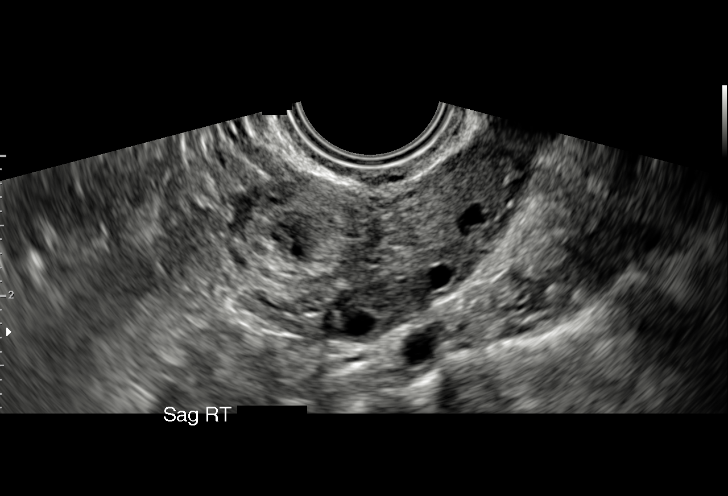

[15 of 28 positions shown; findings below may reference images not displayed]

FINDINGS: Intrauterine gestational sac: Not visualized

Yolk sac:  Not visualized

Embryo:  Not visualized

Maternal uterus/adnexae: Left ovary is within normal limits and
measures 1.8 x 2.6 x 1.4 cm. The right ovary measures 3.6 x 1.7 x 2
cm. Abutting the right ovary in the right adnexa is a complex
heterogeneous mass with increased peripheral echogenicity and
central hypoechogenicity, this measures 2 x 1.7 x 1.3 cm. Increased
peripheral flow around the echogenic mass. Trace amount of complex
fluid in the cul-de-sac.
IMPRESSION: 1. No intrauterine pregnancy visualized. Heterogenous echogenic mass
in the right adnexum measuring 2 cm with peripheral echogenicity,
constellation of findings is suspicious for an ectopic pregnancy.
2. Trace amount of complex fluid in the cul-de-sac.
Critical Value/emergent results were called by telephone at the time
of interpretation on 04/21/2017 at [DATE] to Dr. ELGABI HERING , who
verbally acknowledged these results.

## 2018-09-12 IMAGING — US US OB TRANSVAGINAL
1 series · 15 of 28 positions shown · non-contrast
Comparison: 04/21/2017

CLINICAL DATA: Pain and bleeding for 2 weeks.

EXAM:
TRANSVAGINAL OB ULTRASOUND
TECHNIQUE: Transvaginal ultrasound was performed for complete evaluation of the
gestation as well as the maternal uterus, adnexal regions, and
pelvic cul-de-sac.

[Series 1: us ob transvaginal · 15 of 87 slices shown]
[im 1/87]
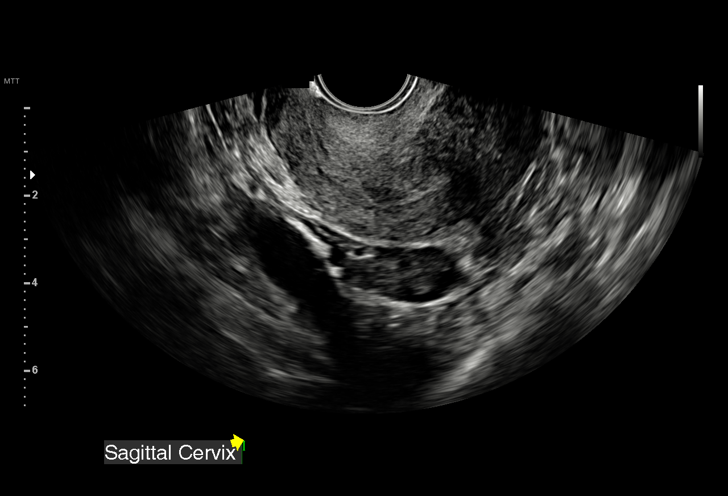
[im 7/87]
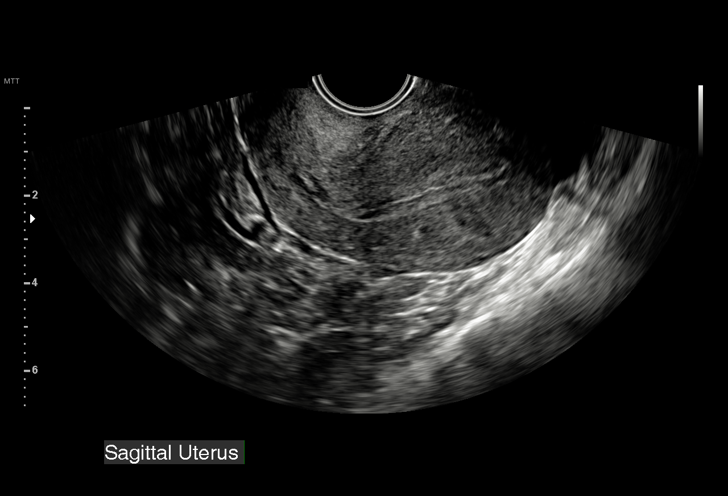
[im 13/87]
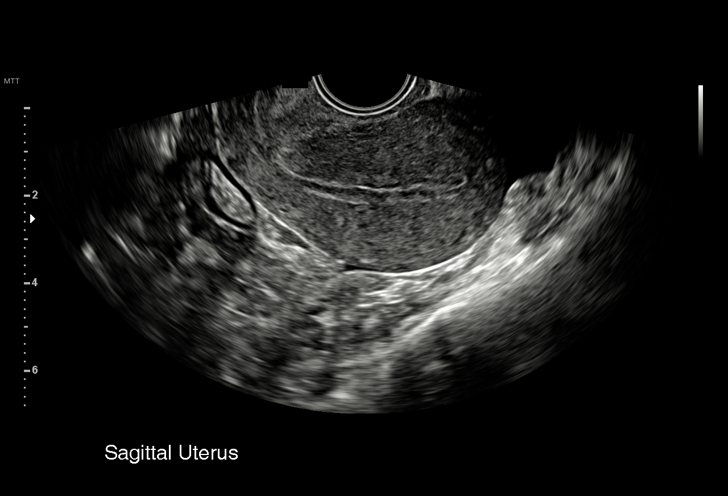
[im 20/87]
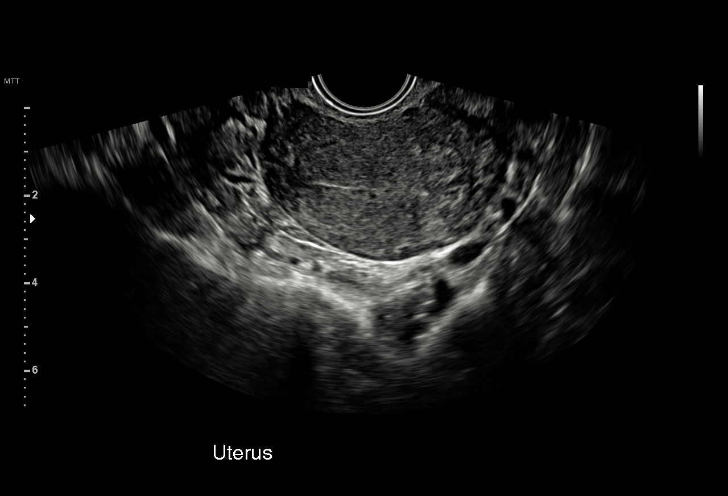
[im 26/87]
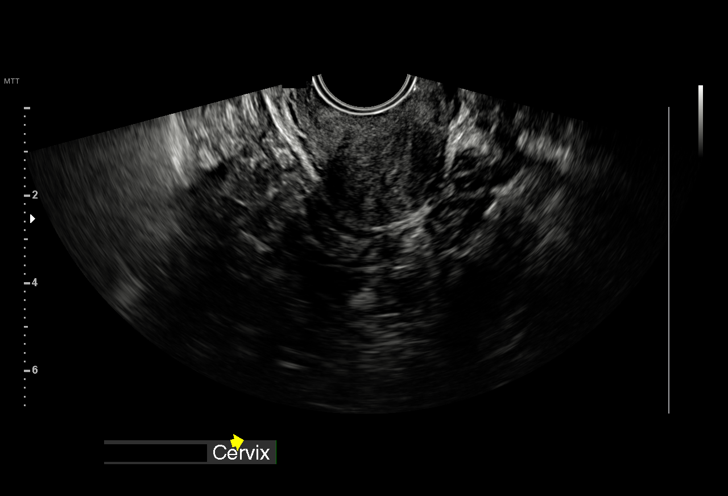
[im 32/87]
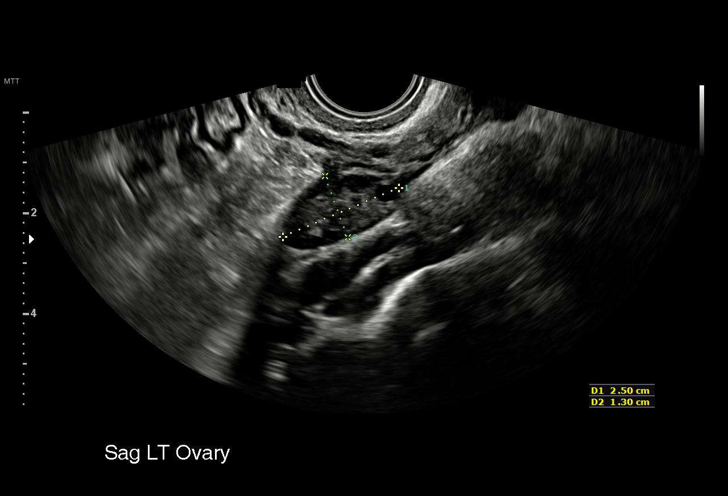
[im 39/87]
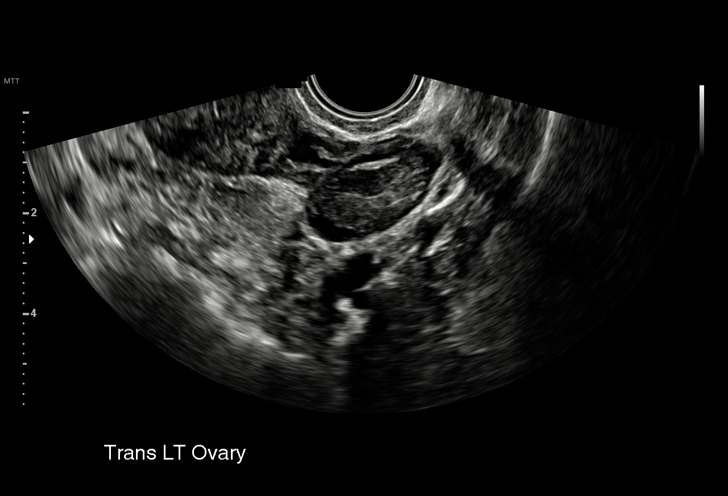
[im 45/87]
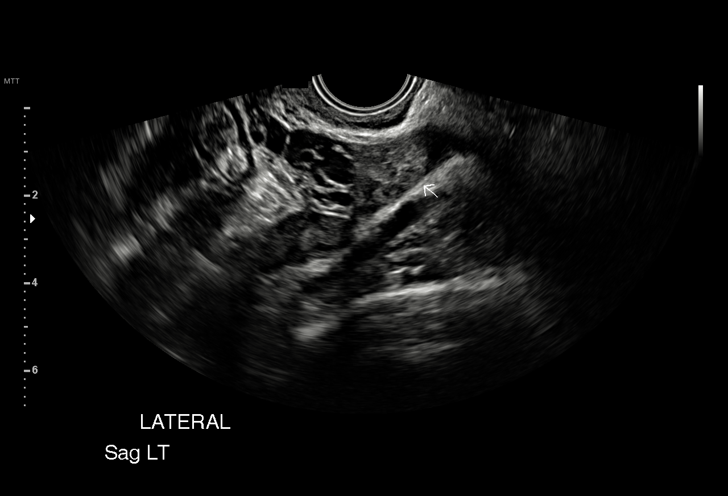
[im 48/87]
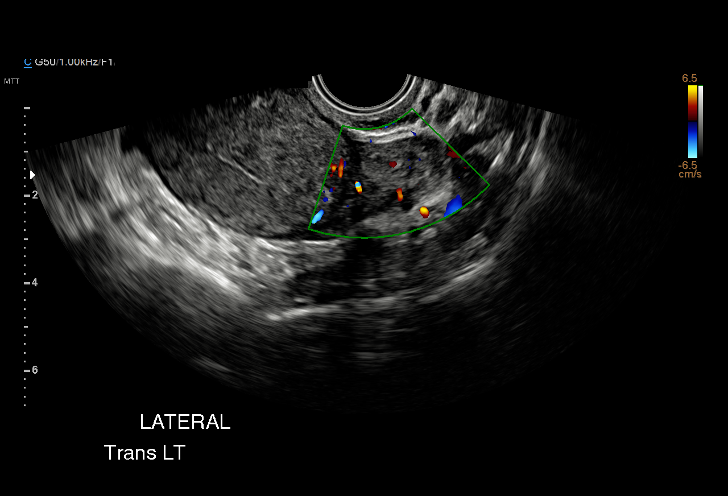
[im 55/87]
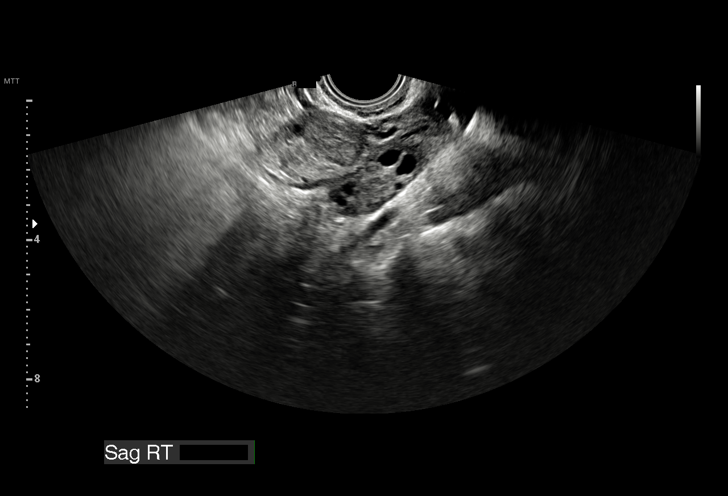
[im 61/87]
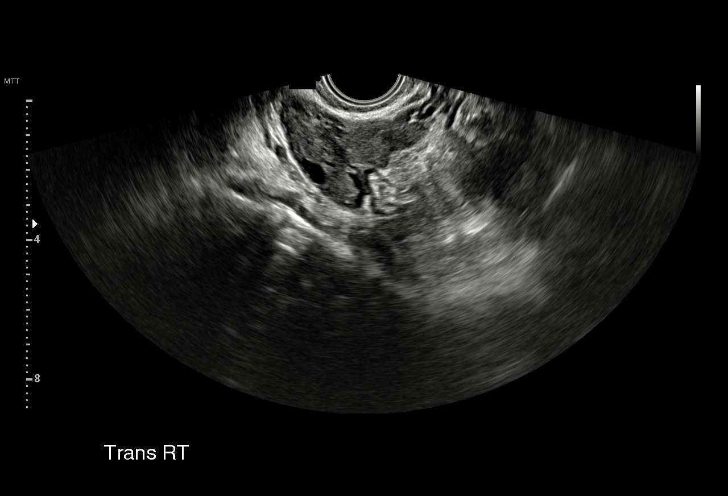
[im 67/87]
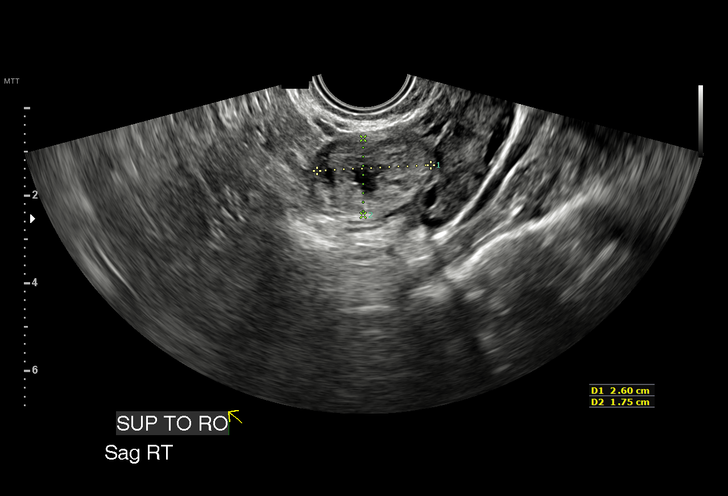
[im 74/87]
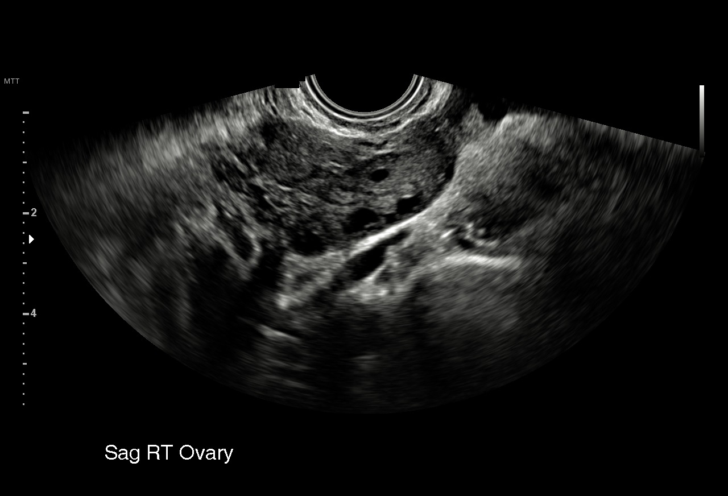
[im 80/87]
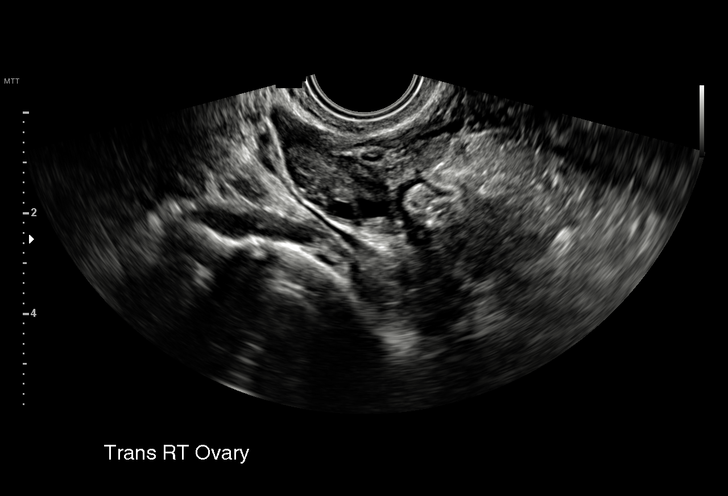
[im 87/87]
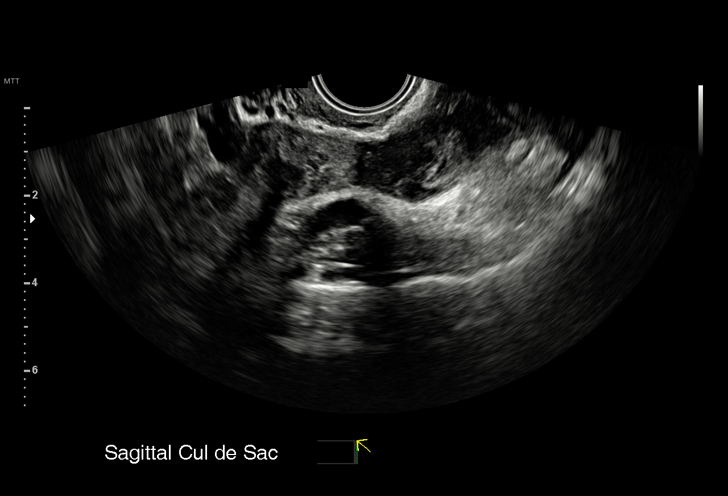

[15 of 28 positions shown; findings below may reference images not displayed]

FINDINGS: Intrauterine gestational sac: Not visible

Yolk sac:  Not visible

Embryo:  Not visible

Cardiac Activity: Not visible

Heart Rate:  bpm

MSD:   mm    w     d

CRL:     mm    w  d                  US EDC:

Subchorionic hemorrhage:  None visualized.

Maternal uterus/adnexae: There is a complex 2.6 cm right adnexal
mass superior to the ovary, not significantly different from
04/21/2017. This likely represents an ectopic gestation. There is a
small volume complex fluid in the cul-de-sac, likely blood.
IMPRESSION: 2.6 cm complex right adnexal mass with appearances consistent with
ectopic gestation. Small volume blood in the pelvis.

These results will be called to the ordering clinician or
representative by the Radiologist Assistant, and communication
documented in the PACS or zVision Dashboard.

## 2019-06-10 ENCOUNTER — Encounter: Payer: Self-pay | Admitting: Internal Medicine

## 2019-06-10 ENCOUNTER — Ambulatory Visit (INDEPENDENT_AMBULATORY_CARE_PROVIDER_SITE_OTHER): Payer: BC Managed Care – PPO | Admitting: Internal Medicine

## 2019-06-10 VITALS — Wt 165.0 lb

## 2019-06-10 DIAGNOSIS — Z20828 Contact with and (suspected) exposure to other viral communicable diseases: Secondary | ICD-10-CM | POA: Diagnosis not present

## 2019-06-10 DIAGNOSIS — Z20822 Contact with and (suspected) exposure to covid-19: Secondary | ICD-10-CM

## 2019-06-10 NOTE — Progress Notes (Signed)
Virtual Visit via Video Note  I connected with Janice Bradley on 06/10/19 at  9:15 AM EDT by a video enabled telemedicine application and verified that I am speaking with the correct person using two identifiers.  Location: Patient: Home Provider: Office   I discussed the limitations of evaluation and management by telemedicine and the availability of in person appointments. The patient expressed understanding and agreed to proceed.  History of Present Illness:  Pt requesting to be tested for COVID 19. She is not having any symptoms at this time, however she has had multiple contacts at work diagnosed with COVID 19. Her work is recommended that she be tested.    Past Medical History:  Diagnosis Date  . Chickenpox   . Recurrent UTI     No current outpatient medications on file.   No current facility-administered medications for this visit.     No Known Allergies  Family History  Problem Relation Age of Onset  . Hyperlipidemia Mother   . Hypertension Mother   . Skin cancer Mother   . Hyperlipidemia Father   . Hypertension Father     Social History   Socioeconomic History  . Marital status: Married    Spouse name: Not on file  . Number of children: Not on file  . Years of education: Not on file  . Highest education level: Not on file  Occupational History  . Not on file  Social Needs  . Financial resource strain: Not on file  . Food insecurity    Worry: Not on file    Inability: Not on file  . Transportation needs    Medical: Not on file    Non-medical: Not on file  Tobacco Use  . Smoking status: Former Games developermoker  . Smokeless tobacco: Never Used  Substance and Sexual Activity  . Alcohol use: Yes    Comment: 1-2 glasses a wine a month  . Drug use: Yes    Types: Marijuana    Comment: 3 weeks ago last use  . Sexual activity: Yes    Birth control/protection: None  Lifestyle  . Physical activity    Days per week: Not on file    Minutes per session: Not on  file  . Stress: Not on file  Relationships  . Social Musicianconnections    Talks on phone: Not on file    Gets together: Not on file    Attends religious service: Not on file    Active member of club or organization: Not on file    Attends meetings of clubs or organizations: Not on file    Relationship status: Not on file  . Intimate partner violence    Fear of current or ex partner: Not on file    Emotionally abused: Not on file    Physically abused: Not on file    Forced sexual activity: Not on file  Other Topics Concern  . Not on file  Social History Narrative   Married.   No children.   Works as a Engineer, productionManager doing inventory.    Enjoys hiking, relaxing, cross stitching      Constitutional: Denies fever, malaise, fatigue, headache or abrupt weight changes.  HEENT: Denies eye pain, eye redness, ear pain, ringing in the ears, wax buildup, runny nose, nasal congestion, bloody nose, or sore throat. Respiratory: Denies difficulty breathing, shortness of breath, cough or sputum production.   Cardiovascular: Denies chest pain, chest tightness, palpitations or swelling in the hands or feet.  Gastrointestinal: Denies abdominal  pain, bloating, constipation, diarrhea or blood in the stool.   No other specific complaints in a complete review of systems (except as listed in HPI above).  Wt 165 lb (74.8 kg)   LMP 06/02/2019   BMI 26.83 kg/m  Wt Readings from Last 3 Encounters:  06/10/19 165 lb (74.8 kg)  01/26/18 167 lb 8 oz (76 kg)  04/27/17 143 lb 4 oz (65 kg)    General: Appears her stated age, well developed, well nourished in NAD. Cardiovascular: Normal rate and rhythm. S1,S2 noted.  No murmur, rubs or gallops noted.  Pulmonary/Chest: Normal effort and positive vesicular breath sounds. No respiratory distress. No wheezes, rales or ronchi noted.  Neurological: Alert and oriented.  BMET    Component Value Date/Time   NA 138 01/26/2018 1017   K 4.0 01/26/2018 1017   CL 104 01/26/2018  1017   CO2 27 01/26/2018 1017   GLUCOSE 91 01/26/2018 1017   BUN 11 01/26/2018 1017   CREATININE 0.67 01/26/2018 1017   CALCIUM 9.0 01/26/2018 1017   GFRNONAA >60 04/21/2017 2133   GFRAA >60 04/21/2017 2133    Lipid Panel     Component Value Date/Time   CHOL 151 01/26/2018 1017   TRIG 70.0 01/26/2018 1017   HDL 45.30 01/26/2018 1017   CHOLHDL 3 01/26/2018 1017   VLDL 14.0 01/26/2018 1017   LDLCALC 92 01/26/2018 1017    CBC    Component Value Date/Time   WBC 8.4 04/27/2017 2100   RBC 4.23 04/27/2017 2100   HGB 13.0 04/27/2017 2100   HCT 38.8 04/27/2017 2100   PLT 296 04/27/2017 2100   MCV 91.7 04/27/2017 2100   MCH 30.7 04/27/2017 2100   MCHC 33.5 04/27/2017 2100   RDW 13.2 04/27/2017 2100   LYMPHSABS 1.8 04/21/2017 2133   MONOABS 0.4 04/21/2017 2133   EOSABS 0.0 04/21/2017 2133   BASOSABS 0.0 04/21/2017 2133    Hgb A1C No results found for: HGBA1C      Assessment and Plan:  Exposure To COVID 19:  Novel Coronavirus Sars 2 ordered Discussed self quarantine until lab has been resulted  Will follow up after lab tests are back  Follow Up Instructions:    I discussed the assessment and treatment plan with the patient. The patient was provided an opportunity to ask questions and all were answered. The patient agreed with the plan and demonstrated an understanding of the instructions.   The patient was advised to call back or seek an in-person evaluation if the symptoms worsen or if the condition fails to improve as anticipated.   Webb Silversmith, NP

## 2019-06-12 LAB — NOVEL CORONAVIRUS, NAA: SARS-CoV-2, NAA: NOT DETECTED

## 2019-07-11 ENCOUNTER — Encounter: Payer: Self-pay | Admitting: Primary Care

## 2019-07-11 ENCOUNTER — Ambulatory Visit (INDEPENDENT_AMBULATORY_CARE_PROVIDER_SITE_OTHER): Payer: BC Managed Care – PPO | Admitting: Primary Care

## 2019-07-11 VITALS — Temp 98.4°F | Wt 165.0 lb

## 2019-07-11 DIAGNOSIS — R509 Fever, unspecified: Secondary | ICD-10-CM | POA: Diagnosis not present

## 2019-07-11 NOTE — Progress Notes (Signed)
Subjective:    Patient ID: Janice Bradley, female    DOB: 1981/05/13, 38 y.o.   MRN: [id number]  HPI  Virtual Visit via Video Note  I connected with Erisa Mehlman on 07/11/19 at 10:20 AM EDT by a video enabled telemedicine application and verified that I am speaking with the correct person using two identifiers.  Location: Patient: Home Provider: Office   I discussed the limitations of evaluation and management by telemedicine and the availability of in person appointments. The patient expressed understanding and agreed to proceed.  History of Present Illness:  Ms. Pommier is a 38 year old female who presents today with a chief complaint of headache.  She also reports sneezing, sinus pressure, rhinorrhea, fever of 101 two days ago, then fever of 101-102 yesterday, no fever today. Symptoms began two days ago.   She was around her step sister last week who had an active sinusitis who was treated with antibiotics. Last night she took naproxen, woke up today afebrile and feeling much better. She has since quarantined as she's unsure if this is Covid or more allergy/sinusitis. She was tested for Covid-19 in mid June 2020 due to several positive cases at work, no direct contact with those fellow employees.   Her employer would like her tested.    Observations/Objective:  Alert and oriented. Appears well, not sickly. No distress. Speaking in complete sentences.   Assessment and Plan:  Symptoms more likely viral sinusitis, especially given improvement in sinus symptoms with resolve in fever.  No known Covid exposure but is going to work. Will have her retested for Covid given employer request.  Discussed Flonase, Zyrtec PRN. She will update.   Follow Up Instructions:  Go to the [facility] building on the [place] for Covid testing.  I'll be in touch with your results once received.   Stay home until your test has returned and until you've been fever  free for three days without the use of naproxen or other fever reducing medications.  It was a pleasure to see you today!    I discussed the assessment and treatment plan with the patient. The patient was provided an opportunity to ask questions and all were answered. The patient agreed with the plan and demonstrated an understanding of the instructions.   The patient was advised to call back or seek an in-person evaluation if the symptoms worsen or if the condition fails to improve as anticipated.     Pleas Koch, NP    Review of Systems  Constitutional: Positive for fever. Negative for chills and fatigue.  HENT: Positive for congestion and sinus pressure.   Respiratory: Negative for cough and shortness of breath.        Past Medical History:  Diagnosis Date   Chickenpox    Recurrent UTI      Social History   Socioeconomic History   Marital status: Married    Spouse name: Not on file   Number of children: Not on file   Years of education: Not on file   Highest education level: Not on file  Occupational History   Not on file  Social Needs   Financial resource strain: Not on file   Food insecurity    Worry: Not on file    Inability: Not on file   Transportation needs    Medical: Not on file    Non-medical: Not on file  Tobacco Use   Smoking status: Former Smoker   Smokeless  tobacco: Never Used  Substance and Sexual Activity   Alcohol use: Yes    Comment: 1-2 glasses a wine a month   Drug use: Yes    Types: Marijuana    Comment: 3 weeks ago last use   Sexual activity: Yes    Birth control/protection: None  Lifestyle   Physical activity    Days per week: Not on file    Minutes per session: Not on file   Stress: Not on file  Relationships   Social connections    Talks on phone: Not on file    Gets together: Not on file    Attends religious service: Not on file    Active member of club or organization: Not on file    Attends  meetings of clubs or organizations: Not on file    Relationship status: Not on file   Intimate partner violence    Fear of current or ex partner: Not on file    Emotionally abused: Not on file    Physically abused: Not on file    Forced sexual activity: Not on file  Other Topics Concern   Not on file  Social History Narrative   Married.   No children.   Works as a Engineer, productionManager doing inventory.    Enjoys hiking, relaxing, cross stitching     Past Surgical History:  Procedure Laterality Date   DIAGNOSTIC LAPAROSCOPY WITH REMOVAL OF ECTOPIC PREGNANCY Right 04/28/2017   Procedure: DIAGNOSTIC LAPAROSCOPY WITH RIGHT SALPINGECTOMY AND REMOVAL OF ECTOPIC PREGNANCY;  Surgeon: Hermina StaggersErvin, Michael L, MD;  Location: [facility];  Service: Gynecology;  Laterality: Right;    Family History  Problem Relation Age of Onset   Hyperlipidemia Mother    Hypertension Mother    Skin cancer Mother    Hyperlipidemia Father    Hypertension Father     No Known Allergies  No current outpatient medications on file prior to visit.   No current facility-administered medications on file prior to visit.     Temp 98.4 F (36.9 C) (Oral)    Wt 165 lb (74.8 kg)    LMP 06/29/2019    BMI 26.83 kg/m    Objective:   Physical Exam  Constitutional: She is oriented to person, place, and time. She appears well-nourished. She does not have a sickly appearance. She does not appear ill.  Respiratory: Effort normal.  Neurological: She is alert and oriented to person, place, and time.  Psychiatric: She has a normal mood and affect.           Assessment & Plan:  Fever:  Symptoms more likely viral sinusitis, especially given improvement in sinus symptoms with resolve in fever.  No known Covid exposure but is going to work. Will have her retested for Covid given employer request.  Discussed Flonase, Zyrtec PRN. She will update.   Doreene NestKatherine K Shevawn Langenberg, NP

## 2019-07-11 NOTE — Patient Instructions (Signed)
  Go to the [facility] building on the [place] for Covid testing.  I'll be in touch with your results once received.   Stay home until your test has returned and until you've been fever free for three days without the use of naproxen or other fever reducing medications.  It was a pleasure to see you today!

## 2019-07-15 ENCOUNTER — Telehealth: Payer: Self-pay | Admitting: Primary Care

## 2019-07-15 LAB — NOVEL CORONAVIRUS, NAA: SARS-CoV-2, NAA: NOT DETECTED

## 2019-07-15 NOTE — Telephone Encounter (Signed)
Her test was negative.

## 2019-07-15 NOTE — Telephone Encounter (Signed)
Patient had covid testing done last week and would like to know if her results have come back. She is wanting to return to work but can not do so until she receives her results.    C/B # [phone]

## 2019-07-16 ENCOUNTER — Encounter: Payer: Self-pay | Admitting: *Deleted

## 2019-07-16 NOTE — Telephone Encounter (Signed)
Spoken and notified patient of Regina Baity's comments. Patient verbalized understanding.  

## 2024-02-16 ENCOUNTER — Other Ambulatory Visit: Payer: Self-pay | Admitting: Primary Care

## 2024-02-16 DIAGNOSIS — Z1231 Encounter for screening mammogram for malignant neoplasm of breast: Secondary | ICD-10-CM

## 2024-02-26 ENCOUNTER — Ambulatory Visit
Admission: RE | Admit: 2024-02-26 | Discharge: 2024-02-26 | Disposition: A | Discharge status: Home / Self Care | Payer: BC Managed Care – PPO | Source: Ambulatory Visit | Attending: Primary Care

## 2024-02-26 DIAGNOSIS — Z1231 Encounter for screening mammogram for malignant neoplasm of breast: Secondary | ICD-10-CM

## 2024-03-01 ENCOUNTER — Other Ambulatory Visit: Payer: Self-pay | Admitting: Primary Care

## 2024-03-01 DIAGNOSIS — R928 Other abnormal and inconclusive findings on diagnostic imaging of breast: Secondary | ICD-10-CM

## 2024-03-05 ENCOUNTER — Telehealth: Payer: Self-pay

## 2024-03-05 NOTE — Telephone Encounter (Signed)
 Copied from CRM [id number]. Topic: Clinical - Request for Lab/Test Order >> Mar 05, 2024 11:28 AM Desma Mcgregor wrote: Reason for CRM: Pt is scheduled for her diagnostic mammogram tomorrow at 1130a. [facility] needs Dr. Chestine Spore to cosign the order asap. Please follow up.

## 2024-03-05 NOTE — Telephone Encounter (Addendum)
 Called and spoke with patient, advised we have not seen her since 2020 and she would need an appt before orders can be signed off, patient understood. Patient has New Pt appt with Modesto Charon on 03/20. She will keep this appt and address concern then. FYI to [employer]

## 2024-03-06 ENCOUNTER — Encounter

## 2024-03-06 NOTE — Telephone Encounter (Signed)
 Noted.

## 2024-03-14 ENCOUNTER — Encounter: Payer: Self-pay | Admitting: General Practice

## 2024-03-14 ENCOUNTER — Ambulatory Visit: Payer: BC Managed Care – PPO | Admitting: General Practice

## 2024-03-14 VITALS — BP 114/80 | HR 78 | Temp 98.1°F | Ht 65.5 in | Wt 185.0 lb

## 2024-03-14 DIAGNOSIS — Z1159 Encounter for screening for other viral diseases: Secondary | ICD-10-CM

## 2024-03-14 DIAGNOSIS — E6609 Other obesity due to excess calories: Secondary | ICD-10-CM | POA: Diagnosis not present

## 2024-03-14 DIAGNOSIS — Z0000 Encounter for general adult medical examination without abnormal findings: Secondary | ICD-10-CM

## 2024-03-14 DIAGNOSIS — Z6830 Body mass index (BMI) 30.0-30.9, adult: Secondary | ICD-10-CM | POA: Diagnosis not present

## 2024-03-14 DIAGNOSIS — E66811 Obesity, class 1: Secondary | ICD-10-CM | POA: Insufficient documentation

## 2024-03-14 DIAGNOSIS — Z124 Encounter for screening for malignant neoplasm of cervix: Secondary | ICD-10-CM

## 2024-03-14 DIAGNOSIS — Z23 Encounter for immunization: Secondary | ICD-10-CM | POA: Diagnosis not present

## 2024-03-14 LAB — COMPREHENSIVE METABOLIC PANEL
ALT: 15 U/L (ref 0–35)
AST: 15 U/L (ref 0–37)
Albumin: 4.9 g/dL (ref 3.5–5.2)
Alkaline Phosphatase: 91 U/L (ref 39–117)
BUN: 11 mg/dL (ref 6–23)
CO2: 23 meq/L (ref 19–32)
Calcium: 9.8 mg/dL (ref 8.4–10.5)
Chloride: 104 meq/L (ref 96–112)
Creatinine, Ser: 0.63 mg/dL (ref 0.40–1.20)
GFR: 109.85 mL/min (ref >60.00)
Glucose, Bld: 97 mg/dL (ref 70–99)
Potassium: 4 meq/L (ref 3.5–5.1)
Sodium: 137 meq/L (ref 135–145)
Total Bilirubin: 0.9 mg/dL (ref 0.2–1.2)
Total Protein: 7.6 g/dL (ref 6.0–8.3)

## 2024-03-14 LAB — CBC
HCT: 44.3 % (ref 36.0–46.0)
Hemoglobin: 14.8 g/dL (ref 12.0–15.0)
MCHC: 33.3 g/dL (ref 30.0–36.0)
MCV: 93.1 fl (ref 78.0–100.0)
Platelets: 312 10*3/uL (ref 150.0–400.0)
RBC: 4.76 Mil/uL (ref 3.87–5.11)
RDW: 14 % (ref 11.5–15.5)
WBC: 6.4 10*3/uL (ref 4.0–10.5)

## 2024-03-14 LAB — TSH: TSH: 1.38 u[IU]/mL (ref 0.35–5.50)

## 2024-03-14 LAB — HEMOGLOBIN A1C: Hgb A1c MFr Bld: 5.4 % (ref 4.6–6.5)

## 2024-03-14 LAB — LIPID PANEL
Cholesterol: 279 mg/dL — ABNORMAL HIGH (ref 0–200)
HDL: 52.8 mg/dL (ref >39.00)
LDL Cholesterol: 194 mg/dL — ABNORMAL HIGH (ref 0–99)
NonHDL: 226.36
Total CHOL/HDL Ratio: 5
Triglycerides: 162 mg/dL — ABNORMAL HIGH (ref 0.0–149.0)
VLDL: 32.4 mg/dL (ref 0.0–40.0)

## 2024-03-14 NOTE — Progress Notes (Signed)
 New Patient Office Visit  Subjective    Patient ID: Janice Bradley, female    DOB: 04/12/1981  Age: 43 y.o. MRN: [id number]  CC:  Chief Complaint  Patient presents with   New Patient (Initial Visit)    Needs order/sign off to get diagnostic MM done; order is in EMR and appt is scheduled for 03/19/24. Patient will return for CPE and PAP; just establishing care today.     HPI Janice Bradley is a 43 y.o. female presents to establish care.   Immunizations: -Tetanus: Completed in 2013.  -Influenza: declines.   Diet: Fair diet.  Exercise: No regular exercise. Physically active, tries to get 10000 steps daily.  Eye exam: Completes every other year. Dental exam: Completes annually.  Pap Smear: last pap smear unknown. Mammogram: Completed in 2025, scheduled for diagnostic mammogram for next week.   No outpatient encounter medications on file as of 03/14/2024.   No facility-administered encounter medications on file as of 03/14/2024.    Past Medical History:  Diagnosis Date   Anxiety    Chickenpox    Hypertension    Recurrent UTI     Past Surgical History:  Procedure Laterality Date   DIAGNOSTIC LAPAROSCOPY WITH REMOVAL OF ECTOPIC PREGNANCY Right 04/28/2017   Procedure: DIAGNOSTIC LAPAROSCOPY WITH RIGHT SALPINGECTOMY AND REMOVAL OF ECTOPIC PREGNANCY;  Surgeon: Hermina Staggers, MD;  Location: [facility];  Service: Gynecology;  Laterality: Right;    Family History  Problem Relation Age of Onset   Hyperlipidemia Mother    Hypertension Mother    Skin cancer Mother    Cancer Mother    Hyperlipidemia Father    Hypertension Father     Social History   Socioeconomic History   Marital status: Married    Spouse name: Not on file   Number of children: Not on file   Years of education: Not on file   Highest education level: Bachelor's degree (e.g., BA, AB, BS)  Occupational History   Not on file  Tobacco Use   Smoking status: Former    Types: Cigarettes   Smokeless  tobacco: Never  Substance and Sexual Activity   Alcohol use: Yes    Alcohol/week: 6.0 standard drinks of alcohol    Types: 6 Glasses of wine per week    Comment: 1-2 glasses a wine a month   Drug use: Not Currently    Types: Marijuana    Comment: 3 weeks ago last use   Sexual activity: Yes    Birth control/protection: None  Other Topics Concern   Not on file  Social History Narrative   Married.   No children.   Works as a Engineer, production.    Enjoys hiking, relaxing, cross stitching    Social Drivers of Health   Financial Resource Strain: Low Risk  (03/13/2024)   Overall Financial Resource Strain (CARDIA)    Difficulty of Paying Living Expenses: Not very hard  Food Insecurity: No Food Insecurity (03/13/2024)   Hunger Vital Sign    Worried About Running Out of Food in the Last Year: Never true    Ran Out of Food in the Last Year: Never true  Transportation Needs: No Transportation Needs (03/13/2024)   PRAPARE - Administrator, Civil Service (Medical): No    Lack of Transportation (Non-Medical): No  Physical Activity: Insufficiently Active (03/13/2024)   Exercise Vital Sign    Days of Exercise per Week: 3 days    Minutes of Exercise per Session: 40  min  Stress: Stress Concern Present (03/13/2024)   [employer] of Occupational Health - Occupational Stress Questionnaire    Feeling of Stress : To some extent  Social Connections: Socially Integrated (03/13/2024)   Social Connection and Isolation Panel [NHANES]    Frequency of Communication with Friends and Family: More than three times a week    Frequency of Social Gatherings with Friends and Family: More than three times a week    Attends Religious Services: More than 4 times per year    Active Member of [employer] or Organizations: Yes    Attends Engineer, structural: More than 4 times per year    Marital Status: Married  Catering manager Violence: Not on file    Review of Systems  Constitutional:   Negative for chills, fever, malaise/fatigue and weight loss.  HENT:  Negative for congestion, ear discharge, ear pain, hearing loss, nosebleeds, sinus pain, sore throat and tinnitus.   Eyes:  Negative for blurred vision, double vision, pain, discharge and redness.  Respiratory:  Negative for cough, shortness of breath, wheezing and stridor.   Cardiovascular:  Negative for chest pain, palpitations and leg swelling.  Gastrointestinal:  Negative for abdominal pain, constipation, diarrhea, heartburn, nausea and vomiting.  Genitourinary:  Negative for dysuria, frequency and urgency.  Musculoskeletal:  Negative for myalgias.  Skin:  Negative for rash.  Neurological:  Negative for dizziness, tingling, seizures, weakness and headaches.  Psychiatric/Behavioral:  Negative for depression, substance abuse and suicidal ideas. The patient is not nervous/anxious.         Objective    BP 114/80 (BP Location: Left Arm, Patient Position: Sitting, Cuff Size: Normal)   Pulse 78   Temp 98.1 F (36.7 C) (Oral)   Ht 5' 5.5" (1.664 m)   Wt 185 lb (83.9 kg)   LMP 02/12/2024 (Approximate)   SpO2 98%   BMI 30.32 kg/m   Physical Exam Vitals and nursing note reviewed.  Constitutional:      Appearance: Normal appearance.  HENT:     Head: Normocephalic and atraumatic.     Right Ear: Tympanic membrane, ear canal and external ear normal.     Left Ear: Tympanic membrane, ear canal and external ear normal.     Nose: Nose normal.     Mouth/Throat:     Mouth: Mucous membranes are moist.     Pharynx: Oropharynx is clear.  Eyes:     Conjunctiva/sclera: Conjunctivae normal.     Pupils: Pupils are equal, round, and reactive to light.  Cardiovascular:     Rate and Rhythm: Normal rate and regular rhythm.     Pulses: Normal pulses.     Heart sounds: Normal heart sounds.  Pulmonary:     Effort: Pulmonary effort is normal.     Breath sounds: Normal breath sounds.  Abdominal:     General: Abdomen is flat. Bowel  sounds are normal.     Palpations: Abdomen is soft.  Musculoskeletal:        General: Normal range of motion.     Cervical back: Normal range of motion.  Skin:    General: Skin is warm and dry.     Capillary Refill: Capillary refill takes less than 2 seconds.  Neurological:     General: No focal deficit present.     Mental Status: She is alert and oriented to person, place, and time. Mental status is at baseline.  Psychiatric:        Mood and Affect: Mood normal.  Behavior: Behavior normal.        Thought Content: Thought content normal.        Judgment: Judgment normal.         Assessment & Plan:  Encounter for screening and preventative care Assessment & Plan: Immunizations tetanus updated today. Declines flu vaccine. Pap smear due- referral placed for gyn. Mammogram UTD; scheduled for diagnostic.   Discussed the importance of a healthy diet and regular exercise in order for weight loss, and to reduce the risk of further co-morbidity.  Exam stable. Labs pending.  Follow up in 1 year for repeat physical.  Orders: -     TSH -     CBC -     Comprehensive metabolic panel -     Lipid panel -     Hemoglobin A1c  Screening for cervical cancer -     Ambulatory referral to Gynecology  Class 1 obesity due to excess calories without serious comorbidity with body mass index (BMI) of 30.0 to 30.9 in adult Assessment & Plan: Discussed the importance of monitoring diet and continue exercise.  Labs pending to rule out metabolic cause.  Orders: -     TSH -     CBC -     Comprehensive metabolic panel -     Lipid panel -     Hemoglobin A1c  Need for hepatitis C screening test -     Hepatitis C antibody  Need for Tdap vaccination -     Tdap vaccine greater than or equal to 7yo IM  Preventative health care Assessment & Plan: Immunizations tetanus updated today. Declines flu vaccine. Pap smear due- referral placed for gyn. Mammogram UTD; scheduled for  diagnostic.   Discussed the importance of a healthy diet and regular exercise in order for weight loss, and to reduce the risk of further co-morbidity.  Exam stable. Labs pending.  Follow up in 1 year for repeat physical.    Return in about 1 year (around 03/14/2025) for physical.   Modesto Charon, NP

## 2024-03-14 NOTE — Assessment & Plan Note (Signed)
 Immunizations tetanus updated today. Declines flu vaccine. Pap smear due- referral placed for gyn. Mammogram UTD; scheduled for diagnostic.   Discussed the importance of a healthy diet and regular exercise in order for weight loss, and to reduce the risk of further co-morbidity.  Exam stable. Labs pending.  Follow up in 1 year for repeat physical.

## 2024-03-14 NOTE — Assessment & Plan Note (Signed)
 Discussed the importance of monitoring diet and continue exercise.  Labs pending to rule out metabolic cause.

## 2024-03-14 NOTE — Patient Instructions (Signed)
 Stop by the lab prior to leaving today. I will notify you of your results once received.   You will either be contacted via phone regarding your referral to gynecology, or you may receive a letter on your MyChart portal from our referral team with instructions for scheduling an appointment. Please let us know if you have not been contacted by anyone within two weeks.   It was a pleasure meeting you!

## 2024-03-15 ENCOUNTER — Other Ambulatory Visit: Payer: Self-pay | Admitting: General Practice

## 2024-03-15 ENCOUNTER — Encounter: Payer: Self-pay | Admitting: General Practice

## 2024-03-15 DIAGNOSIS — R928 Other abnormal and inconclusive findings on diagnostic imaging of breast: Secondary | ICD-10-CM

## 2024-03-15 LAB — HEPATITIS C ANTIBODY: Hepatitis C Ab: NONREACTIVE

## 2024-03-19 ENCOUNTER — Other Ambulatory Visit: Payer: Self-pay | Admitting: General Practice

## 2024-03-19 ENCOUNTER — Ambulatory Visit
Admission: RE | Admit: 2024-03-19 | Discharge: 2024-03-19 | Disposition: A | Discharge status: Home / Self Care | Source: Ambulatory Visit | Attending: Primary Care

## 2024-03-19 DIAGNOSIS — R921 Mammographic calcification found on diagnostic imaging of breast: Secondary | ICD-10-CM

## 2024-03-19 DIAGNOSIS — R928 Other abnormal and inconclusive findings on diagnostic imaging of breast: Secondary | ICD-10-CM

## 2024-03-29 ENCOUNTER — Ambulatory Visit
Admission: RE | Admit: 2024-03-29 | Discharge: 2024-03-29 | Disposition: A | Discharge status: Home / Self Care | Source: Ambulatory Visit | Attending: General Practice | Admitting: General Practice

## 2024-03-29 DIAGNOSIS — R921 Mammographic calcification found on diagnostic imaging of breast: Secondary | ICD-10-CM

## 2024-03-29 HISTORY — PX: BREAST BIOPSY: SHX20

## 2024-04-01 LAB — SURGICAL PATHOLOGY

## 2024-04-09 ENCOUNTER — Encounter: Admitting: Obstetrics and Gynecology

## 2024-04-23 ENCOUNTER — Encounter: Admitting: Obstetrics and Gynecology

## 2024-05-28 ENCOUNTER — Encounter: Admitting: Obstetrics and Gynecology

## 2024-07-15 NOTE — Progress Notes (Unsigned)
 This encounter was created in error - please disregard.

## 2024-07-16 ENCOUNTER — Encounter: Admitting: Obstetrics and Gynecology

## 2024-07-16 DIAGNOSIS — Z01419 Encounter for gynecological examination (general) (routine) without abnormal findings: Secondary | ICD-10-CM

## 2024-07-18 NOTE — Patient Instructions (Signed)
 Morning Sickness Morning sickness is when you throw up or feel like you may throw up during pregnancy. This condition often occurs in the morning, but it can also occur at any time of day. Morning sickness is most common during the first three months of pregnancy, but it can go on throughout the pregnancy. Morning sickness is usually harmless. But if you throw up all the time, you should see your health care provider. You may also hear this condition called nausea and vomiting of pregnancy. What are the causes? The cause of morning sickness is not known. It may be linked to changes in hormones during pregnancy. What increases the risk? You're more likely to have morning sickness if: You had morning sickness in another pregnancy. You're pregnant with more than one baby, such as twins. You had morning sickness in other pregnancies. You have had motion sickness before you were pregnant. You have had bad headaches or migraines before you were pregnant. What are the signs or symptoms? Symptoms of morning sickness include: Feeling like you may throw up. Throwing up. How is this diagnosed? Morning sickness is diagnosed based on your symptoms. How is this treated? Treatment is usually not needed for morning sickness. You may only need to change what you eat. In some cases, your provider may give you: Vitamin B6 supplements. Medicines to prevent throwing up. Ginger. Follow these instructions at home: Medicines Take your medicines only as told by your provider. Do not use any prescription, over-the-counter, or herbal medicines for morning sickness without first talking with your provider. Take prenatal vitamins. These can stop or lessen the symptoms of morning sickness. If you feel like you may throw up after taking prenatal vitamins, take them at night or with a snack. Eating and drinking     Eat dry toast or crackers before getting out of bed. Eat 5 or 6 small meals a day. Try ginger ale  made with real ginger, ginger tea, or ginger candies. Drink fluids throughout the day. Eat protein foods when you need a snack. Nuts, yogurt, and cheese are good choices. Eat dry and bland foods like rice or baked potatoes. Foods that are high in carbohydrates are often helpful. Have someone cook for you if the smell of food makes you want to throw up. Foods to avoid Greasy foods. Fatty foods. Spicy foods. General instructions Try to avoid smells that make you feel sick. Use an air purifier to keep the air in your house free of smells. Try using an acupressure wristband. This is a wristband that's used to treat motion sickness. Try acupuncture. In this treatment, a provider puts thin needles into certain areas of your body to make you feel better. Brush your teeth after throwing up or rinse with a mix of baking soda and water. The acid in throw-up can hurt your teeth. Contact a health care provider if: Your symptoms do not get better. You feel dizzy or light-headed. You're losing weight. Get help right away if: The feeling that you may throw up will not go away, or you can't stop throwing up. You faint. You have very bad pain in your belly. This information is not intended to replace advice given to you by your health care provider. Make sure you discuss any questions you have with your health care provider. Document Revised: 09/14/2023 Document Reviewed: 03/23/2023 Elsevier Patient Education  2024 Else[employer] Inc.First Trimester of Pregnancy  The first trimester of pregnancy starts on the first day of your last monthly period  until the end of week 13. This is months 1 through 3 of pregnancy. A week after a sperm fertilizes an egg, the egg will implant into the wall of the uterus and begin to develop into a baby. Body changes during your first trimester Your body goes through many changes during pregnancy. The changes usually return to normal after your baby is born. Physical  changes Your breasts may grow larger and may hurt. The area around your nipples may get darker. Your periods will stop. Your hair and nails may grow faster. You may pee more often. Health changes You may tire easily. Your gums may bleed and may be sensitive when you brush and floss. You may not feel hungry. You may have heartburn. You may throw up or feel like you may throw up. You may want to eat some foods, but not others. You may have headaches. You may have trouble pooping (constipation). Other changes Your emotions may change from day to day. You may have more dreams. Follow these instructions at home: Medicines Talk to your health care provider if you're taking medicines. Ask if the medicines are safe to take during pregnancy. Your provider may change the medicines that you take. Do not take any medicines unless told to by your provider. Take a prenatal vitamin that has at least 600 micrograms (mcg) of folic acid. Do not use herbal medicines, illegal substances, or medicines that are not approved by your provider. Eating and drinking While you're pregnant your body needs extra food for your growing baby. Talk with your provider about what to eat while pregnant. Activity Most women are able to exercise during pregnancy. Exercises may need to change as your pregnancy goes on. Talk to your provider about your activities and exercise routines. Relieving pain and discomfort Wear a good, supportive bra if your breasts hurt. Rest with your legs raised if you have leg cramps or low back pain. Safety Wear your seatbelt at all times when you're in a car. Talk to your provider if someone hits you, hurts you, or yells at you. Talk with your provider if you're feeling sad or have thoughts of hurting yourself. Lifestyle Certain things can be harmful while you're pregnant. Follow these rules: Do not use hot tubs, steam rooms, or saunas. Do not douche. Do not use tampons or scented  pads. Do not drink alcohol,smoke, vape, or use products with nicotine or tobacco in them. If you need help quitting, talk with your provider. Avoid cat litter boxes and soil used by cats. These things carry germs that can cause harm to your pregnancy and your baby. General instructions Keep all follow-up visits. It helps you and your unborn baby stay as healthy as possible. Write down your questions. Take them to your visits. Your provider will: Talk with you about your overall health. Give you advice or refer you to specialists who can help with different needs, including: Prenatal education classes. Mental health and counseling. Foods and healthy eating. Ask for help if you need help with food. Call your dentist and ask to be seen. Brush your teeth with a soft toothbrush. Floss gently. Where to find more information American Pregnancy Association: americanpregnancy.org [employer]: acog.org Office on [employer] Health: [employer] Contact a health care provider if: You feel dizzy, faint, or have a fever. You vomit or have watery poop (diarrhea) for 2 days or more. You have abnormal discharge or bleeding from your vagina. You have pain when you pee or  your pee smells bad. You have cramps, pain, or pressure in your belly area. Get help right away if: You have trouble breathing or chest pain. You have any kind of injury, such as from a fall or a car crash. These symptoms may be an emergency. Get help right away. Call 911. Do not wait to see if the symptoms will go away. Do not drive yourself to the hospital. This information is not intended to replace advice given to you by your health care provider. Make sure you discuss any questions you have with your health care provider. Document Revised: 09/14/2023 Document Reviewed: 04/14/2023 Elsevier Patient Education  2024 Elsevier Inc.Commonly Asked Questions During Pregnancy  Cats: A parasite can be  excreted in cat feces.  To avoid exposure you need to have another person empty the little box.  If you must empty the litter box you will need to wear gloves.  Wash your hands after handling your cat.  This parasite can also be found in raw or undercooked meat so this should also be avoided.  Colds, Sore Throats, Flu: Please check your medication sheet to see what you can take for symptoms.  If your symptoms are unrelieved by these medications please call the office.  Dental Work: Most any dental work Agricultural consultant recommends is permitted.  X-rays should only be taken during the first trimester if absolutely necessary.  Your abdomen should be shielded with a lead apron during all x-rays.  Please notify your provider prior to receiving any x-rays.  Novocaine is fine; gas is not recommended.  If your dentist requires a note from [place] prior to dental work please call the office and we will provide one for you.  Exercise: Exercise is an important part of staying healthy during your pregnancy.  You may continue most exercises you were accustomed to prior to pregnancy.  Later in your pregnancy you will most likely notice you have difficulty with activities requiring balance like riding a bicycle.  It is important that you listen to your body and avoid activities that put you at a higher risk of falling.  Adequate rest and staying well hydrated are a must!  If you have questions about the safety of specific activities ask your provider.    Exposure to Children with illness: Try to avoid obvious exposure; report any symptoms to [place] when noted,  If you have chicken pos, red measles or mumps, you should be immune to these diseases.   Please do not take any vaccines while pregnant unless you have checked with your OB provider.  Fetal Movement: After 28 weeks we recommend you do "kick counts" twice daily.  Lie or sit down in a calm quiet environment and count your baby movements "kicks".  You should feel your baby at  least 10 times per hour.  If you have not felt 10 kicks within the first hour get up, walk around and have something sweet to eat or drink then repeat for an additional hour.  If count remains less than 10 per hour notify your provider.  Fumigating: Follow your pest control agent's advice as to how long to stay out of your home.  Ventilate the area well before re-entering.  Hemorrhoids:   Most over-the-counter preparations can be used during pregnancy.  Check your medication to see what is safe to use.  It is important to use a stool softener or fiber in your diet and to drink lots of liquids.  If hemorrhoids seem to be  getting worse please call the office.   Hot Tubs:  Hot tubs Jacuzzis and saunas are not recommended while pregnant.  These increase your internal body temperature and should be avoided.  Intercourse:  Sexual intercourse is safe during pregnancy as long as you are comfortable, unless otherwise advised by your provider.  Spotting may occur after intercourse; report any bright red bleeding that is heavier than spotting.  Labor:  If you know that you are in labor, please go to the hospital.  If you are unsure, please call the office and let us help you decide what to do.  Lifting, straining, etc:  If your job requires heavy lifting or straining please check with your provider for any limitations.  Generally, you should not lift items heavier than that you can lift simply with your hands and arms (no back muscles)  Painting:  Paint fumes do not harm your pregnancy, but may make you ill and should be avoided if possible.  Latex or water based paints have less odor than oils.  Use adequate ventilation while painting.  Permanents & Hair Color:  Chemicals in hair dyes are not recommended as they cause increase hair dryness which can increase hair loss during pregnancy.  " Highlighting" and permanents are allowed.  Dye may be absorbed differently and permanents may not hold as well during  pregnancy.  Sunbathing:  Use a sunscreen, as skin burns easily during pregnancy.  Drink plenty of fluids; avoid over heating.  Tanning Beds:  Because their possible side effects are still unknown, tanning beds are not recommended.  Ultrasound Scans:  Routine ultrasounds are performed at approximately 20 weeks.  You will be able to see your baby's general anatomy an if you would like to know the gender this can usually be determined as well.  If it is questionable when you conceived you may also receive an ultrasound early in your pregnancy for dating purposes.  Otherwise ultrasound exams are not routinely performed unless there is a medical necessity.  Although you can request a scan we ask that you pay for it when conducted because insurance does not cover " patient request" scans.  Work: If your pregnancy proceeds without complications you may work until your due date, unless your physician or employer advises otherwise.  Round Ligament Pain/Pelvic Discomfort:  Sharp, shooting pains not associated with bleeding are fairly common, usually occurring in the second trimester of pregnancy.  They tend to be worse when standing up or when you remain standing for long periods of time.  These are the result of pressure of certain pelvic ligaments called "round ligaments".  Rest, Tylenol and heat seem to be the most effective relief.  As the womb and fetus grow, they rise out of the pelvis and the discomfort improves.  Please notify the office if your pain seems different than that described.  It may represent a more serious condition.  Common Medications Safe in Pregnancy  Acne:      Constipation:  Benzoyl Peroxide     Colace  Clindamycin      Dulcolax Suppository  Topica Erythromycin     Fibercon  Salicylic Acid      Metamucil         Miralax AVOID:        Senakot   Accutane    Cough:  Retin-A       Cough Drops  Tetracycline      Phenergan w/ Codeine if Rx  Minocycline  Robitussin (Plain &  DM)  Antibiotics:     Crabs/Lice:  Ceclor       RID  Cephalosporins    AVOID:  E-Mycins      Kwell  Keflex  Macrobid/Macrodantin   Diarrhea:  Penicillin      Kao-Pectate  Zithromax      Imodium AD         PUSH FLUIDS AVOID:       Cipro     Fever:  Tetracycline      Tylenol (Regular or Extra  Minocycline       Strength)  Levaquin      Extra Strength-Do not          Exceed 8 tabs/24 hrs Caffeine:        200mg /day (equiv. To 1 cup of coffee or  approx. 3 12 oz sodas)         Gas: Cold/Hayfever:       Gas-X  Benadryl      Mylicon  Claritin       Phazyme  **Claritin-D        Chlor-Trimeton    Headaches:  Dimetapp      ASA-Free Excedrin  Drixoral-Non-Drowsy     Cold Compress  Mucinex (Guaifenasin)     Tylenol (Regular or Extra  Sudafed/Sudafed-12 Hour     Strength)  **Sudafed PE Pseudoephedrine   Tylenol Cold & Sinus     Vicks Vapor Rub  Zyrtec  **AVOID if Problems With Blood Pressure         Heartburn: Avoid lying down for at least 1 hour after meals  Aciphex      Maalox     Rash:  Milk of Magnesia     Benadryl    Mylanta       1% Hydrocortisone Cream  Pepcid  Pepcid Complete   Sleep Aids:  Prevacid      Ambien   Prilosec       Benadryl  Rolaids       Chamomile Tea  Tums (Limit 4/day)     Unisom         Tylenol PM         Warm milk-add vanilla or  Hemorrhoids:       Sugar for taste  Anusol/Anusol H.C.  (RX: Analapram 2.5%)  Sugar Substitutes:  Hydrocortisone OTC     Ok in moderation  Preparation H      Tucks        Vaseline lotion applied to tissue with wiping    Herpes:     Throat:  Acyclovir      Oragel  Famvir  Valtrex     Vaccines:         Flu Shot Leg Cramps:       *Gardasil  Benadryl      Hepatitis A         Hepatitis B Nasal Spray:       Pneumovax  Saline Nasal Spray     Polio Booster         Tetanus Nausea:       Tuberculosis test or PPD  Vitamin B6 25 mg TID   AVOID:    Dramamine      *Gardasil  Emetrol       Live Poliovirus  Ginger  Root 250 mg QID    MMR (measles, mumps &  High Complex Carbs @ Bedtime    rebella)  Sea Bands-Accupressure    Varicella (Chickenpox)  Unisom 1/2  tab TID     *No known complications           If received before Pain:         Known pregnancy;   Darvocet       Resume series after  Lortab        Delivery  Percocet    Yeast:   Tramadol      Femstat  Tylenol 3      Gyne-lotrimin  Ultram       Monistat  Vicodin           MISC:         All Sunscreens           Hair Coloring/highlights          Insect Repellant's          (Including DEET)         Mystic Tans

## 2024-07-18 NOTE — Progress Notes (Unsigned)
 Pregnancy Confirmation Visit  SUBJECTIVE  Janice Bradley is a 43 y.o. G3P0010  who presents for evaluation of amenorrhea and possible pregnancy. Patient's last menstrual period was 05/29/2024 (exact date). and was Normal. She reports regular periods. Pregnancy is desired. Current symptoms include breast tenderness and mild nausea  Review of Systems Review of Systems  Constitutional:  Negative for chills and fever.  HENT:  Negative for congestion, ear discharge, ear pain, hearing loss, sinus pain and sore throat.   Eyes:  Negative for blurred vision and double vision.  Respiratory:  Negative for cough, shortness of breath and wheezing.   Cardiovascular:  Negative for chest pain, palpitations and leg swelling.  Gastrointestinal:  Positive for nausea. Negative for abdominal pain, blood in stool, constipation, diarrhea, heartburn, melena and vomiting.  Genitourinary:  Negative for dysuria, flank pain, frequency, hematuria and urgency.  Musculoskeletal:  Negative for back pain, joint pain and myalgias.  Skin:  Negative for itching and rash.  Neurological:  Negative for dizziness, tingling, tremors, sensory change, speech change, focal weakness, seizures, loss of consciousness, weakness and headaches.  Endo/Heme/Allergies:  Negative for environmental allergies. Does not bruise/bleed easily.       Positive for breast tenderness  Psychiatric/Behavioral:  Negative for depression, hallucinations, memory loss, substance abuse and suicidal ideas. The patient is not nervous/anxious and does not have insomnia.        Positive for emotional lability     OBJECTIVE BP (!) 140/90   Pulse 83   Resp 16   Ht 5' 6 (1.676 m)   Wt 181 lb 12.8 oz (82.5 kg)   LMP 05/29/2024 (Exact Date)   BMI 29.34 kg/m  Body mass index is 29.34 kg/m.  alert, well appearing, and in no distress, oriented to person, place, and time, and emotional due to history of previous ectopic pregnancies  Lab Review Urine hCG:  positive  ASSESSMENT Amenorrhea.  Presumed pregnancy at [redacted]w[redacted]d  by LMP with Estimated Date of Delivery: 03/05/2025  PLAN  Encouraged a well-balanced diet, rest, hydration, prenatal vitamins, and walking for exercise. Counseled to avoid alcohol, tobacco, and recreational drugs and to minimize caffeine intake. Safe medications list given. Discussed non-pharmacologic relief measures for nausea. Oriented to [facility]. Genetic screening options were discussed.  A dating US  was ordered to be scheduled within a week. Return in 4 weeks for labs, NOB physical, and genetic screening if desired.     Slater Rains, CNM

## 2024-07-19 ENCOUNTER — Encounter: Payer: Self-pay | Admitting: Advanced Practice Midwife

## 2024-07-19 ENCOUNTER — Ambulatory Visit: Admitting: Advanced Practice Midwife

## 2024-07-19 VITALS — BP 140/90 | HR 83 | Resp 16 | Ht 66.0 in | Wt 181.8 lb

## 2024-07-19 DIAGNOSIS — O3680X0 Pregnancy with inconclusive fetal viability, not applicable or unspecified: Secondary | ICD-10-CM

## 2024-07-19 DIAGNOSIS — N912 Amenorrhea, unspecified: Secondary | ICD-10-CM

## 2024-07-19 DIAGNOSIS — Z3201 Encounter for pregnancy test, result positive: Secondary | ICD-10-CM | POA: Diagnosis not present

## 2024-07-19 LAB — POCT URINE PREGNANCY: Preg Test, Ur: POSITIVE — AB

## 2024-07-19 MED ORDER — PRENATAL VITAMINS 28-0.8 MG PO TABS: 1.0000 | ORAL_TABLET | Freq: Every day | ORAL | 5 refills | Status: AC

## 2024-07-24 ENCOUNTER — Ambulatory Visit

## 2024-07-24 DIAGNOSIS — Z3A08 8 weeks gestation of pregnancy: Secondary | ICD-10-CM

## 2024-07-24 DIAGNOSIS — O3680X0 Pregnancy with inconclusive fetal viability, not applicable or unspecified: Secondary | ICD-10-CM

## 2024-07-26 ENCOUNTER — Telehealth

## 2024-07-26 DIAGNOSIS — Z3689 Encounter for other specified antenatal screening: Secondary | ICD-10-CM

## 2024-07-26 DIAGNOSIS — Z3480 Encounter for supervision of other normal pregnancy, unspecified trimester: Secondary | ICD-10-CM | POA: Insufficient documentation

## 2024-07-26 NOTE — Progress Notes (Signed)
 New OB Intake  I connected with  Janice Bradley on 07/26/24 at  8:15 AM EDT by MyChart Video Visit and verified that I am speaking with the correct person using two identifiers. Nurse is located at [facility] and pt is located at home.  I discussed the limitations, risks, security and privacy concerns of performing an evaluation and management service by telephone and the availability of in person appointments. I also discussed with the patient that there may be a patient responsible charge related to this service. The patient expressed understanding and agreed to proceed.  I explained I am completing New OB Intake today. We discussed her EDD of 03/05/25 that is based on LMP of 05/29/24. Pt is G3/P0. I reviewed her allergies, medications, Medical/Surgical/OB history, and appropriate screenings. There are cats in the home: no. Based on history, this is a/an pregnancy uncomplicated . Her obstetrical history is significant for N/A.  Patient Active Problem List   Diagnosis Date Noted   Supervision of other normal pregnancy, antepartum 07/26/2024    Concerns addressed today: None  Delivery Plans:  Plans to deliver at [facility].  Anatomy US  Explained Anatomy US  will be scheduled around [redacted] weeks gestational age.  Labs Discussed genetic screening with patient. Patient desires genetic testing to be drawn at new OB visit. Discussed possible labs to be drawn at new OB appointment.  COVID Vaccine Patient has had COVID vaccine.   Social Determinants of Health Food Insecurity: denies food insecurity  Transportation: Patient denies transportation needs.  First visit review I reviewed new OB appt with pt. I explained she will have blood work and pap smear/pelvic exam if indicated. Explained pt will be seen by Jinnie Cookey, CNM at first visit; encounter routed to appropriate provider.   Beola Skeens, CMA 07/26/2024  8:25 AM

## 2024-07-26 NOTE — Patient Instructions (Signed)
 First Trimester of Pregnancy  The first trimester of pregnancy starts on the first day of your last monthly period until the end of week 13. This is months 1 through 3 of pregnancy. A week after a sperm fertilizes an egg, the egg will implant into the wall of the uterus and begin to develop into a baby. Body changes during your first trimester Your body goes through many changes during pregnancy. The changes usually return to normal after your baby is born. Physical changes Your breasts may grow larger and may hurt. The area around your nipples may get darker. Your periods will stop. Your hair and nails may grow faster. You may pee more often. Health changes You may tire easily. Your gums may bleed and may be sensitive when you brush and floss. You may not feel hungry. You may have heartburn. You may throw up or feel like you may throw up. You may want to eat some foods, but not others. You may have headaches. You may have trouble pooping (constipation). Other changes Your emotions may change from day to day. You may have more dreams. Follow these instructions at home: Medicines Talk to your health care provider if you're taking medicines. Ask if the medicines are safe to take during pregnancy. Your provider may change the medicines that you take. Do not take any medicines unless told to by your provider. Take a prenatal vitamin that has at least 600 micrograms (mcg) of folic acid. Do not use herbal medicines, illegal substances, or medicines that are not approved by your provider. Eating and drinking While you're pregnant your body needs extra food for your growing baby. Talk with your provider about what to eat while pregnant. Activity Most women are able to exercise during pregnancy. Exercises may need to change as your pregnancy goes on. Talk to your provider about your activities and exercise routines. Relieving pain and discomfort Wear a good, supportive bra if your breasts  hurt. Rest with your legs raised if you have leg cramps or low back pain. Safety Wear your seatbelt at all times when you're in a car. Talk to your provider if someone hits you, hurts you, or yells at you. Talk with your provider if you're feeling sad or have thoughts of hurting yourself. Lifestyle Certain things can be harmful while you're pregnant. Follow these rules: Do not use hot tubs, steam rooms, or saunas. Do not douche. Do not use tampons or scented pads. Do not drink alcohol,smoke, vape, or use products with nicotine or tobacco in them. If you need help quitting, talk with your provider. Avoid cat litter boxes and soil used by cats. These things carry germs that can cause harm to your pregnancy and your baby. General instructions Keep all follow-up visits. It helps you and your unborn baby stay as healthy as possible. Write down your questions. Take them to your visits. Your provider will: Talk with you about your overall health. Give you advice or refer you to specialists who can help with different needs, including: Prenatal education classes. Mental health and counseling. Foods and healthy eating. Ask for help if you need help with food. Call your dentist and ask to be seen. Brush your teeth with a soft toothbrush. Floss gently. Where to find more information American Pregnancy Association: americanpregnancy.org [employer]stetricians and Gynecologists: acog.org Office on [employer] Health: TravelLesson.ca Contact a health care provider if: You feel dizzy, faint, or have a fever. You vomit or have watery poop (diarrhea) for 2  days or more. You have abnormal discharge or bleeding from your vagina. You have pain when you pee or your pee smells bad. You have cramps, pain, or pressure in your belly area. Get help right away if: You have trouble breathing or chest pain. You have any kind of injury, such as from a fall or a car crash. These symptoms may be an  emergency. Get help right away. Call 911. Do not wait to see if the symptoms will go away. Do not drive yourself to the hospital. This information is not intended to replace advice given to you by your health care provider. Make sure you discuss any questions you have with your health care provider. Document Revised: 09/14/2023 Document Reviewed: 04/14/2023 Elsevier Patient Education  2024 Elsevier Inc.   Common Medications Safe in Pregnancy  Acne:      Constipation:  Benzoyl Peroxide     Colace  Clindamycin      Dulcolax Suppository  Topica Erythromycin     Fibercon  Salicylic Acid      Metamucil         Miralax AVOID:        Senakot   Accutane    Cough:  Retin-A       Cough Drops  Tetracycline      Phenergan w/ Codeine if Rx  Minocycline      Robitussin (Plain & DM)  Antibiotics:     Crabs/Lice:  Ceclor       RID  Cephalosporins    AVOID:  E-Mycins      Kwell  Keflex  Macrobid/Macrodantin   Diarrhea:  Penicillin      Kao-Pectate  Zithromax      Imodium AD         PUSH FLUIDS AVOID:       Cipro     Fever:  Tetracycline      Tylenol (Regular or Extra  Minocycline       Strength)  Levaquin      Extra Strength-Do not          Exceed 8 tabs/24 hrs Caffeine:        200mg /day (equiv. To 1 cup of coffee or  approx. 3 12 oz sodas)         Gas: Cold/Hayfever:       Gas-X  Benadryl      Mylicon  Claritin       Phazyme  **Claritin-D        Chlor-Trimeton    Headaches:  Dimetapp      ASA-Free Excedrin  Drixoral-Non-Drowsy     Cold Compress  Mucinex (Guaifenasin)     Tylenol (Regular or Extra  Sudafed/Sudafed-12 Hour     Strength)  **Sudafed PE Pseudoephedrine   Tylenol Cold & Sinus     Vicks Vapor Rub  Zyrtec  **AVOID if Problems With Blood Pressure         Heartburn: Avoid lying down for at least 1 hour after meals  Aciphex      Maalox     Rash:  Milk of Magnesia     Benadryl    Mylanta       1% Hydrocortisone Cream  Pepcid  Pepcid Complete   Sleep  Aids:  Prevacid      Ambien   Prilosec       Benadryl  Rolaids       Chamomile Tea  Tums (Limit 4/day)     Unisom  Tylenol PM         Warm milk-add vanilla or  Hemorrhoids:       Sugar for taste  Anusol/Anusol H.C.  (RX: Analapram 2.5%)  Sugar Substitutes:  Hydrocortisone OTC     Ok in moderation  Preparation H      Tucks        Vaseline lotion applied to tissue with wiping    Herpes:     Throat:  Acyclovir      Oragel  Famvir  Valtrex     Vaccines:         Flu Shot Leg Cramps:       *Gardasil  Benadryl      Hepatitis A         Hepatitis B Nasal Spray:       Pneumovax  Saline Nasal Spray     Polio Booster         Tetanus Nausea:       Tuberculosis test or PPD  Vitamin B6 25 mg TID   AVOID:    Dramamine      *Gardasil  Emetrol       Live Poliovirus  Ginger Root 250 mg QID    MMR (measles, mumps &  High Complex Carbs @ Bedtime    rebella)  Sea Bands-Accupressure    Varicella (Chickenpox)  Unisom 1/2 tab TID     *No known complications           If received before Pain:         Known pregnancy;   Darvocet       Resume series after  Lortab        Delivery  Percocet    Yeast:   Tramadol      Femstat  Tylenol 3      Gyne-lotrimin  Ultram       Monistat  Vicodin           MISC:         All Sunscreens           Hair Coloring/highlights          Insect Repellant's          (Including DEET)         Mystic Tans   Commonly Asked Questions During Pregnancy   Cats: A parasite can be excreted in cat feces.  To avoid exposure you need to have another person empty the little box.  If you must empty the litter box you will need to wear gloves.  Wash your hands after handling your cat.  This parasite can also be found in raw or undercooked meat so this should also be avoided.  Colds, Sore Throats, Flu: Please check your medication sheet to see what you can take for symptoms.  If your symptoms are unrelieved by these medications please call the office.  Dental Work: Most  any dental work Agricultural consultant recommends is permitted.  X-rays should only be taken during the first trimester if absolutely necessary.  Your abdomen should be shielded with a lead apron during all x-rays.  Please notify your provider prior to receiving any x-rays.  Novocaine is fine; gas is not recommended.  If your dentist requires a note from [place] prior to dental work please call the office and we will provide one for you.  Exercise: Exercise is an important part of staying healthy during your pregnancy.  You may continue most exercises you were accustomed to prior to pregnancy.  Later in your pregnancy you will most likely notice you have difficulty with activities requiring balance like riding a bicycle.  It is important that you listen to your body and avoid activities that put you at a higher risk of falling.  Adequate rest and staying well hydrated are a must!  If you have questions about the safety of specific activities ask your provider.    Exposure to Children with illness: Try to avoid obvious exposure; report any symptoms to [place] when noted,  If you have chicken pos, red measles or mumps, you should be immune to these diseases.   Please do not take any vaccines while pregnant unless you have checked with your OB provider.  Fetal Movement: After 28 weeks we recommend you do "kick counts" twice daily.  Lie or sit down in a calm quiet environment and count your baby movements "kicks".  You should feel your baby at least 10 times per hour.  If you have not felt 10 kicks within the first hour get up, walk around and have something sweet to eat or drink then repeat for an additional hour.  If count remains less than 10 per hour notify your provider.  Fumigating: Follow your pest control agent's advice as to how long to stay out of your home.  Ventilate the area well before re-entering.  Hemorrhoids:   Most over-the-counter preparations can be used during pregnancy.  Check your medication to see what is  safe to use.  It is important to use a stool softener or fiber in your diet and to drink lots of liquids.  If hemorrhoids seem to be getting worse please call the office.   Hot Tubs:  Hot tubs Jacuzzis and saunas are not recommended while pregnant.  These increase your internal body temperature and should be avoided.  Intercourse:  Sexual intercourse is safe during pregnancy as long as you are comfortable, unless otherwise advised by your provider.  Spotting may occur after intercourse; report any bright red bleeding that is heavier than spotting.  Labor:  If you know that you are in labor, please go to the hospital.  If you are unsure, please call the office and let us help you decide what to do.  Lifting, straining, etc:  If your job requires heavy lifting or straining please check with your provider for any limitations.  Generally, you should not lift items heavier than that you can lift simply with your hands and arms (no back muscles)  Painting:  Paint fumes do not harm your pregnancy, but may make you ill and should be avoided if possible.  Latex or water based paints have less odor than oils.  Use adequate ventilation while painting.  Permanents & Hair Color:  Chemicals in hair dyes are not recommended as they cause increase hair dryness which can increase hair loss during pregnancy.  " Highlighting" and permanents are allowed.  Dye may be absorbed differently and permanents may not hold as well during pregnancy.  Sunbathing:  Use a sunscreen, as skin burns easily during pregnancy.  Drink plenty of fluids; avoid over heating.  Tanning Beds:  Because their possible side effects are still unknown, tanning beds are not recommended.  Ultrasound Scans:  Routine ultrasounds are performed at approximately 20 weeks.  You will be able to see your baby's general anatomy an if you would like to know the gender this can usually be determined as well.  If it is questionable when you conceived you may  also  receive an ultrasound early in your pregnancy for dating purposes.  Otherwise ultrasound exams are not routinely performed unless there is a medical necessity.  Although you can request a scan we ask that you pay for it when conducted because insurance does not cover " patient request" scans.  Work: If your pregnancy proceeds without complications you may work until your due date, unless your physician or employer advises otherwise.  Round Ligament Pain/Pelvic Discomfort:  Sharp, shooting pains not associated with bleeding are fairly common, usually occurring in the second trimester of pregnancy.  They tend to be worse when standing up or when you remain standing for long periods of time.  These are the result of pressure of certain pelvic ligaments called "round ligaments".  Rest, Tylenol and heat seem to be the most effective relief.  As the womb and fetus grow, they rise out of the pelvis and the discomfort improves.  Please notify the office if your pain seems different than that described.  It may represent a more serious condition.

## 2024-08-22 ENCOUNTER — Ambulatory Visit (INDEPENDENT_AMBULATORY_CARE_PROVIDER_SITE_OTHER): Admitting: Licensed Practical Nurse

## 2024-08-22 ENCOUNTER — Encounter: Payer: Self-pay | Admitting: Licensed Practical Nurse

## 2024-08-22 ENCOUNTER — Other Ambulatory Visit (HOSPITAL_COMMUNITY)
Admission: RE | Admit: 2024-08-22 | Discharge: 2024-08-22 | Disposition: A | Discharge status: Home / Self Care | Source: Ambulatory Visit | Attending: Licensed Practical Nurse | Admitting: Licensed Practical Nurse

## 2024-08-22 VITALS — BP 137/80 | HR 87 | Wt 185.9 lb

## 2024-08-22 DIAGNOSIS — Z3401 Encounter for supervision of normal first pregnancy, first trimester: Secondary | ICD-10-CM | POA: Diagnosis present

## 2024-08-22 DIAGNOSIS — Z3A12 12 weeks gestation of pregnancy: Secondary | ICD-10-CM

## 2024-08-22 DIAGNOSIS — Z124 Encounter for screening for malignant neoplasm of cervix: Secondary | ICD-10-CM | POA: Diagnosis present

## 2024-08-22 DIAGNOSIS — Z1379 Encounter for other screening for genetic and chromosomal anomalies: Secondary | ICD-10-CM

## 2024-08-22 DIAGNOSIS — Z369 Encounter for antenatal screening, unspecified: Secondary | ICD-10-CM

## 2024-08-22 DIAGNOSIS — O09529 Supervision of elderly multigravida, unspecified trimester: Secondary | ICD-10-CM | POA: Insufficient documentation

## 2024-08-22 DIAGNOSIS — Z0283 Encounter for blood-alcohol and blood-drug test: Secondary | ICD-10-CM

## 2024-08-22 MED ORDER — ASPIRIN 81 MG PO TBEC: 81.0000 mg | DELAYED_RELEASE_TABLET | Freq: Every day | ORAL | Status: AC

## 2024-08-22 NOTE — Progress Notes (Signed)
 NEW OB HISTORY AND PHYSICAL  SUBJECTIVE:       Janice Bradley is a 43 y.o. G75P0020 female, Patient's last menstrual period was 05/29/2024 (exact date)., Estimated Date of Delivery: 03/05/25, [redacted]w[redacted]d, presents today for establishment of Prenatal Care. Here with her husband  This is a happy surprise, Rozelle has had 2 ectopics pregnancies (needed to have one fallopian tube removed), assumed she would never be pregnant.  She had an US  on 07/24/24 that showed an SIUP at 8wks, with an EDD 03/05/25 She reports breast tenderness, fatigue, some cramping, denies vaginal bleeding    HTN noted in chart, pt reports that her BP is only elevated at medical visits, she was never diagnosed with [facility], is not on any medications.   HX of skin cancer 4 months ago, had concerning area removed Mammogram up to date, had small clip inserted for a concern subsequent testing negative   Social history Partner/Relationship:married Living situation:Husband this will be his first child, Theme park manager for [employer]  Exercise:cardio, but not as much in early pregnancy  Substance use: denies tobacco/nicotine, alcohol in pregnancy, used MJ occasionally in the past   Dental up to date  Plans to breastfeed Denies HSV   Indications for ASA therapy (per uptodate) One of the following: Previous pregnancy with preeclampsia, especially early onset and with an adverse outcome No Multifetal gestation No Chronic hypertension No Type 1 or 2 diabetes mellitus No Chronic kidney disease No Autoimmune disease (antiphospholipid syndrome, systemic lupus erythematosus) No  Two or more of the following: Nulliparity Yes Obesity (body mass index >30 kg/m2) Yes Family history of preeclampsia in mother or sister not asked  Age >=35 years Yes Sociodemographic characteristics (African American race, low socioeconomic level) No Personal risk factors (eg, previous pregnancy with low birth weight or small for  gestational age infant, previous adverse pregnancy outcome [eg, stillbirth], interval >10 years between pregnancies) No   Gynecologic History Patient's last menstrual period was 05/29/2024 (exact date). Normal Contraception: none Last Pap: not on record . Results were: normal  Obstetric History OB History  Gravida Para Term Preterm AB Living  3    2   SAB IAB Ectopic Multiple Live Births    2      # Outcome Date GA Lbr Len/2nd Weight Sex Type Anes PTL Lv  3 Current           2 Ectopic 2018          1 Ectopic 2017            Past Medical History:  Diagnosis Date   Anxiety    Chickenpox    Hypertension    Recurrent UTI     Past Surgical History:  Procedure Laterality Date   BREAST BIOPSY Right 03/29/2024   MM RT BREAST BX W LOC DEV 1ST LESION IMAGE BX SPEC STEREO GUIDE 03/29/2024 [facility]   DIAGNOSTIC LAPAROSCOPY WITH REMOVAL OF ECTOPIC PREGNANCY Right 04/28/2017   Procedure: DIAGNOSTIC LAPAROSCOPY WITH RIGHT SALPINGECTOMY AND REMOVAL OF ECTOPIC PREGNANCY;  Surgeon: Lorence Ozell CROME, MD;  Location: [facility];  Service: Gynecology;  Laterality: Right;    Current Outpatient Medications on File Prior to Visit  Medication Sig Dispense Refill   Prenatal Vit-Fe Fumarate-FA (PRENATAL VITAMINS) 28-0.8 MG TABS Take 1 tablet by mouth daily at 6 (six) AM. 90 tablet 5   No current facility-administered medications on file prior to visit.    No Known Allergies  Social History   Socioeconomic History  Marital status: Married    Spouse name: Not on file   Number of children: 0   Years of education: Not on file   Highest education level: Bachelor's degree (e.g., BA, AB, BS)  Occupational History   Occupation: Interior and spatial designer: [employer]  Tobacco Use   Smoking status: Former    Types: Cigarettes   Smokeless tobacco: Never  Vaping Use   Vaping status: Never Used  Substance and Sexual Activity   Alcohol use: Not Currently    Alcohol/week: 6.0 standard drinks  of alcohol    Types: 6 Glasses of wine per week   Drug use: Not Currently    Types: Marijuana   Sexual activity: Yes    Partners: Male    Birth control/protection: None  Other Topics Concern   Not on file  Social History Narrative   Married.   No children.   Works as a Engineer, production.    Enjoys hiking, relaxing, cross stitching    Social Drivers of Health   Financial Resource Strain: Low Risk  (07/25/2024)   Overall Financial Resource Strain (CARDIA)    Difficulty of Paying Living Expenses: Not hard at all  Food Insecurity: No Food Insecurity (07/25/2024)   Hunger Vital Sign    Worried About Running Out of Food in the Last Year: Never true    Ran Out of Food in the Last Year: Never true  Transportation Needs: No Transportation Needs (07/25/2024)   PRAPARE - Administrator, Civil Service (Medical): No    Lack of Transportation (Non-Medical): No  Physical Activity: Insufficiently Active (07/25/2024)   Exercise Vital Sign    Days of Exercise per Week: 3 days    Minutes of Exercise per Session: 30 min  Stress: No Stress Concern Present (07/25/2024)   [employer] of Occupational Health - Occupational Stress Questionnaire    Feeling of Stress: Only a little  Social Connections: Socially Integrated (07/25/2024)   Social Connection and Isolation Panel    Frequency of Communication with Friends and Family: More than three times a week    Frequency of Social Gatherings with Friends and Family: Three times a week    Attends Religious Services: More than 4 times per year    Active Member of Clubs or Organizations: Yes    Attends Banker Meetings: More than 4 times per year    Marital Status: Married  Catering manager Violence: Not At Risk (07/26/2024)   Humiliation, Afraid, Rape, and Kick questionnaire    Fear of Current or Ex-Partner: No    Emotionally Abused: No    Physically Abused: No    Sexually Abused: No    Family History  Problem Relation  Age of Onset   Hyperlipidemia Mother    Hypertension Mother    Skin cancer Mother    Hyperlipidemia Father    Hypertension Father     The following portions of the patient's history were reviewed and updated as appropriate: allergies, current medications, past OB history, past medical history, past surgical history, past family history, past social history, and problem list.  Constitutional: Denied constitutional symptoms, night sweats, recent illness, fever, insomnia and weight loss. Yes fatigue   Eyes: Denied eye symptoms, eye pain, photophobia, vision change and visual disturbance.  Ears/Nose/Throat/Neck: Denied ear, nose, throat or neck symptoms, hearing loss, nasal discharge, sinus congestion and sore throat.  Cardiovascular: Denied cardiovascular symptoms, arrhythmia, chest pain/pressure, edema, exercise intolerance, orthopnea and palpitations.  Respiratory:  Denied pulmonary symptoms, asthma, pleuritic pain, productive sputum, cough, dyspnea and wheezing.  Gastrointestinal: Denied gastro-esophageal reflux, melena, nausea and vomiting. Yes cramping  Genitourinary: Denied genitourinary symptoms including symptomatic vaginal discharge, pelvic relaxation issues, and urinary complaints.  Musculoskeletal: Denied musculoskeletal symptoms, stiffness, swelling, muscle weakness and myalgia.  Dermatologic: Denied dermatology symptoms, rash and scar.  Neurologic: Denied neurology symptoms, dizziness, headache, neck pain and syncope.  Psychiatric: Denied psychiatric symptoms, anxiety and depression.  Endocrine: Denied endocrine symptoms including hot flashes and night sweats.     OBJECTIVE: Initial Physical Exam (New OB)  Physical Exam Constitutional:      Appearance: Normal appearance.  Cardiovascular:     Rate and Rhythm: Normal rate and regular rhythm.     Pulses: Normal pulses.     Heart sounds: Normal heart sounds.  Pulmonary:     Effort: Pulmonary effort is normal.     Breath  sounds: Normal breath sounds.  Chest:     Comments: Breasts: a little widely spaced, soft, no masses or redness, nipples slightly flat intact bilaterally,  Abdominal:     Tenderness: There is no abdominal tenderness.     Comments: Fetal heart tones 166   Genitourinary:    General: Normal vulva.     Comments: SSE: cervix pink no lesions, small amount of bleeding with pap collection  Musculoskeletal:     Cervical back: Normal range of motion and neck supple.     Right lower leg: No edema.     Left lower leg: No edema.  Skin:    General: Skin is warm.  Neurological:     General: No focal deficit present.     Mental Status: She is alert.  Psychiatric:        Mood and Affect: Mood normal.        Thought Content: Thought content normal.     Fetal Heart Rate (bpm): 166  ASSESSMENT: Normal pregnancy   PLAN: Routine prenatal care. We discussed an overview of prenatal care and when to call. Reviewed diet, exercise, and weight gain recommendations in pregnancy. Discussed benefits of breastfeeding and lactation resources at [facility]. I reviewed labs and answered all questions.  1. Genetic screening - MaterniT21 PLUS Core  2. Encounter for supervision of normal first pregnancy in first trimester (Primary) - NOB Panel - Culture, OB Urine - Monitor Drug Profile 14(MW) - Nicotine screen, urine - Urinalysis, Routine w reflex microscopic - Comprehensive metabolic panel - Hemoglobin A1c - Hgb Fractionation Cascade - Protein / creatinine ratio, urine - TSH + free T4 - Cytology - PAP - MaterniT21 PLUS Core - US  OB Comp + 14 Wk; Future  3. [redacted] weeks gestation of pregnancy - NOB Panel - Culture, OB Urine - Monitor Drug Profile 14(MW) - Nicotine screen, urine - Urinalysis, Routine w reflex microscopic - Comprehensive metabolic panel - Hemoglobin A1c - Hgb Fractionation Cascade - Protein / creatinine ratio, urine - TSH + free T4 - Cytology - PAP - MaterniT21 PLUS Core - US  OB Comp +  14 Wk; Future  4. Cervical cancer screening - Cytology - PAP  5. Encounter for drug screening - Monitor Drug Profile 14(MW)  6. Encounter for fetal ultrasound - US  OB Comp + 14 Wk; Future  Discussed POC for AMA and BMI >30 Advised starting baby ASA    Erica Osuna M Jessiah Steinhart, CNM

## 2024-08-23 LAB — PROTEIN / CREATININE RATIO, URINE
Creatinine, Urine: 45.3 mg/dL
Protein, Ur: 6.3 mg/dL
Protein/Creat Ratio: 139 mg/g{creat} (ref 0–200)

## 2024-08-23 LAB — URINALYSIS, ROUTINE W REFLEX MICROSCOPIC
Bilirubin, UA: NEGATIVE
Glucose, UA: NEGATIVE
Ketones, UA: NEGATIVE
Leukocytes,UA: NEGATIVE
Nitrite, UA: NEGATIVE
Protein,UA: NEGATIVE
RBC, UA: NEGATIVE
Specific Gravity, UA: 1.011 (ref 1.005–1.030)
Urobilinogen, Ur: 0.2 mg/dL (ref 0.2–1.0)
pH, UA: 6.5 (ref 5.0–7.5)

## 2024-08-24 LAB — MONITOR DRUG PROFILE 14(MW)
Amphetamine Scrn, Ur: NEGATIVE ng/mL
BARBITURATE SCREEN URINE: NEGATIVE ng/mL
BENZODIAZEPINE SCREEN, URINE: NEGATIVE ng/mL
Buprenorphine, Urine: NEGATIVE ng/mL
CANNABINOIDS UR QL SCN: NEGATIVE ng/mL
Cocaine (Metab) Scrn, Ur: NEGATIVE ng/mL
Creatinine(Crt), U: 51.1 mg/dL (ref 20.0–300.0)
Fentanyl, Urine: NEGATIVE pg/mL
Meperidine Screen, Urine: NEGATIVE ng/mL
Methadone Screen, Urine: NEGATIVE ng/mL
OXYCODONE+OXYMORPHONE UR QL SCN: NEGATIVE ng/mL
Opiate Scrn, Ur: NEGATIVE ng/mL
Ph of Urine: 6.3 (ref 4.5–8.9)
Phencyclidine Qn, Ur: NEGATIVE ng/mL
Propoxyphene Scrn, Ur: NEGATIVE ng/mL
SPECIFIC GRAVITY: 1.011
Tramadol Screen, Urine: NEGATIVE ng/mL

## 2024-08-24 LAB — NICOTINE SCREEN, URINE: Cotinine Ql Scrn, Ur: NEGATIVE ng/mL

## 2024-08-24 LAB — CULTURE, OB URINE

## 2024-08-24 LAB — URINE CULTURE, OB REFLEX

## 2024-08-25 LAB — CBC/D/PLT+RPR+RH+ABO+RUBIGG...
Antibody Screen: NEGATIVE
Basophils Absolute: 0 x10E3/uL (ref 0.0–0.2)
Basos: 1 %
EOS (ABSOLUTE): 0 x10E3/uL (ref 0.0–0.4)
Eos: 0 %
HCV Ab: NONREACTIVE
HIV Screen 4th Generation wRfx: NONREACTIVE
Hematocrit: 40.9 % (ref 34.0–46.6)
Hemoglobin: 13.6 g/dL (ref 11.1–15.9)
Hepatitis B Surface Ag: NEGATIVE
Immature Grans (Abs): 0 x10E3/uL (ref 0.0–0.1)
Immature Granulocytes: 0 %
Lymphocytes Absolute: 2.1 x10E3/uL (ref 0.7–3.1)
Lymphs: 24 %
MCH: 30.7 pg (ref 26.6–33.0)
MCHC: 33.3 g/dL (ref 31.5–35.7)
MCV: 92 fL (ref 79–97)
Monocytes Absolute: 0.7 x10E3/uL (ref 0.1–0.9)
Monocytes: 8 %
Neutrophils Absolute: 5.7 x10E3/uL (ref 1.4–7.0)
Neutrophils: 66 %
Platelets: 248 x10E3/uL (ref 150–450)
RBC: 4.43 x10E6/uL (ref 3.77–5.28)
RDW: 12.7 % (ref 11.7–15.4)
RPR Ser Ql: NONREACTIVE
Rh Factor: POSITIVE
Rubella Antibodies, IGG: 2.29 {index} (ref >0.99)
Varicella zoster IgG: REACTIVE
WBC: 8.7 x10E3/uL (ref 3.4–10.8)

## 2024-08-25 LAB — COMPREHENSIVE METABOLIC PANEL WITH GFR
ALT: 11 IU/L (ref 0–32)
AST: 12 IU/L (ref 0–40)
Albumin: 4 g/dL (ref 3.9–4.9)
Alkaline Phosphatase: 72 IU/L (ref 44–121)
BUN/Creatinine Ratio: 16 (ref 9–23)
BUN: 7 mg/dL (ref 6–24)
Bilirubin Total: 0.2 mg/dL (ref 0.0–1.2)
CO2: 17 mmol/L — ABNORMAL LOW (ref 20–29)
Calcium: 9.3 mg/dL (ref 8.7–10.2)
Chloride: 104 mmol/L (ref 96–106)
Creatinine, Ser: 0.45 mg/dL — ABNORMAL LOW (ref 0.57–1.00)
Globulin, Total: 2.5 g/dL (ref 1.5–4.5)
Glucose: 76 mg/dL (ref 70–99)
Potassium: 3.9 mmol/L (ref 3.5–5.2)
Sodium: 137 mmol/L (ref 134–144)
Total Protein: 6.5 g/dL (ref 6.0–8.5)
eGFR: 123 mL/min/1.73 (ref >59)

## 2024-08-25 LAB — HEMOGLOBIN A1C
Est. average glucose Bld gHb Est-mCnc: 105 mg/dL
Hgb A1c MFr Bld: 5.3 % (ref 4.8–5.6)

## 2024-08-25 LAB — HGB FRACTIONATION CASCADE
Hgb A2: 2.6 % (ref 1.8–3.2)
Hgb A: 97.4 % (ref 96.4–98.8)
Hgb F: 0 % (ref 0.0–2.0)
Hgb S: 0 %

## 2024-08-25 LAB — TSH+FREE T4
Free T4: 0.81 ng/dL — ABNORMAL LOW (ref 0.82–1.77)
TSH: 1.08 u[IU]/mL (ref 0.450–4.500)

## 2024-08-25 LAB — HCV INTERPRETATION

## 2024-08-26 LAB — MATERNIT 21 PLUS CORE, BLOOD
Fetal Fraction: 14
Result (T21): NEGATIVE
Trisomy 13 (Patau syndrome): NEGATIVE
Trisomy 18 (Edwards syndrome): NEGATIVE
Trisomy 21 (Down syndrome): NEGATIVE

## 2024-08-27 LAB — CYTOLOGY - PAP
Chlamydia: NEGATIVE
Comment: NEGATIVE
Comment: NEGATIVE
Comment: NORMAL
Diagnosis: UNDETERMINED — AB
High risk HPV: NEGATIVE
Neisseria Gonorrhea: NEGATIVE

## 2024-08-30 ENCOUNTER — Ambulatory Visit: Payer: Self-pay | Admitting: Licensed Practical Nurse

## 2024-09-17 ENCOUNTER — Ambulatory Visit: Admitting: General Practice

## 2024-09-18 NOTE — Progress Notes (Unsigned)
    Return Prenatal Note   Assessment/Plan   Plan  43 y.o. G3P0020 at [redacted]w[redacted]d presents for follow-up OB visit. Reviewed prenatal record including previous visit note.  AMA (advanced maternal age) multigravida 35+ Reviewed prenatal lab results. Growth scans q 4 wks starting at 28 wks.  Weekly ANT 34 wks. IOL 39-40 wks. RTC 4 wks for anatomy scan and ROB.  ASCUS of cervix with negative high risk HPV Repeat pap w/ cotesting in 8/ 2028 (42yrs)    No orders of the defined types were placed in this encounter.  No follow-ups on file.   Future Appointments  Date Time Provider Department Center  10/14/2024  8:15 AM [facility] US  1 [facility] None  10/14/2024  9:35 AM Dominic, Jinnie Jansky, CNM [facility] None    For next visit:  Routine prenatal care    Subjective  Doing well. Has questions re: prenatal lab results.  Fatigue got better around 13 wks. No nausea/ vomiting.   Movement: Absent Contractions: Not present  Objective   Flow sheet Vitals: Pulse Rate: 83 BP: 107/64 Fetal Heart Rate (bpm): 145 Total weight gain: 5 lb (2.268 kg)  General Appearance  No acute distress, well appearing, and well nourished Pulmonary   Normal work of breathing Neurologic   Alert and oriented to person, place, and time Psychiatric   Mood and affect within normal limits  Lauraine Lakes, CNM 09/19/24 8:53 AM

## 2024-09-19 ENCOUNTER — Ambulatory Visit: Admitting: Obstetrics

## 2024-09-19 VITALS — BP 107/64 | HR 83 | Wt 188.0 lb

## 2024-09-19 DIAGNOSIS — Z3402 Encounter for supervision of normal first pregnancy, second trimester: Secondary | ICD-10-CM

## 2024-09-19 DIAGNOSIS — O09529 Supervision of elderly multigravida, unspecified trimester: Secondary | ICD-10-CM

## 2024-09-19 DIAGNOSIS — R87610 Atypical squamous cells of undetermined significance on cytologic smear of cervix (ASC-US): Secondary | ICD-10-CM | POA: Diagnosis not present

## 2024-09-19 DIAGNOSIS — O3442 Maternal care for other abnormalities of cervix, second trimester: Secondary | ICD-10-CM | POA: Diagnosis not present

## 2024-09-19 DIAGNOSIS — O09522 Supervision of elderly multigravida, second trimester: Secondary | ICD-10-CM

## 2024-09-19 DIAGNOSIS — Z3A16 16 weeks gestation of pregnancy: Secondary | ICD-10-CM

## 2024-09-19 DIAGNOSIS — Z3480 Encounter for supervision of other normal pregnancy, unspecified trimester: Secondary | ICD-10-CM

## 2024-09-19 NOTE — Assessment & Plan Note (Addendum)
 Reviewed prenatal lab results. Growth scans q 4 wks starting at 28 wks.  Weekly ANT 34 wks. IOL 39-40 wks. RTC 4 wks for anatomy scan and ROB.

## 2024-09-19 NOTE — Assessment & Plan Note (Signed)
 Repeat pap w/ cotesting in 8/ 2028 (32yrs)

## 2024-10-03 ENCOUNTER — Other Ambulatory Visit

## 2024-10-14 ENCOUNTER — Encounter: Admitting: Licensed Practical Nurse

## 2024-10-14 ENCOUNTER — Ambulatory Visit

## 2024-10-14 DIAGNOSIS — Z369 Encounter for antenatal screening, unspecified: Secondary | ICD-10-CM | POA: Diagnosis not present

## 2024-10-14 DIAGNOSIS — Z3402 Encounter for supervision of normal first pregnancy, second trimester: Secondary | ICD-10-CM | POA: Diagnosis not present

## 2024-10-14 DIAGNOSIS — Z3A19 19 weeks gestation of pregnancy: Secondary | ICD-10-CM | POA: Diagnosis not present

## 2024-10-14 DIAGNOSIS — Z3A12 12 weeks gestation of pregnancy: Secondary | ICD-10-CM

## 2024-10-14 DIAGNOSIS — Z3401 Encounter for supervision of normal first pregnancy, first trimester: Secondary | ICD-10-CM

## 2024-10-15 ENCOUNTER — Encounter: Admitting: Certified Nurse Midwife

## 2024-10-17 NOTE — Progress Notes (Unsigned)
    Return Prenatal Note   Subjective   43 y.o. G3P0020 at [redacted]w[redacted]d presents for this follow-up prenatal visit.  Patient is doing well. She is unsure if she has felt fetal movement. She denies other concerns. Patient reports: Movement: Absent Contractions: Not present  Objective   Flow sheet Vitals: Pulse Rate: 88 BP: (!) 140/65 Total weight gain: 10 lb 8 oz (4.763 kg)  General Appearance  No acute distress, well appearing, and well nourished Pulmonary   Normal work of breathing Neurologic   Alert and oriented to person, place, and time Psychiatric   Mood and affect within normal limits   Assessment/Plan   Plan  43 y.o. G3P0020 at [redacted]w[redacted]d presents for follow-up OB visit. Reviewed prenatal record including previous visit note.  Supervision of other normal pregnancy, antepartum Needs to return on 11/11 to complete cardiac views from anatomy US .  Reviewed red flag warning signs anticipatory guidance for upcoming prenatal care.    AMA (advanced maternal age) multigravida 35+ AFP collected today      Orders Placed This Encounter  Procedures   AFP, Serum, Open Spina Bifida    Is patient insulin dependent?:   No    Patient weight (lb.):   193.5#    Gestational Age (GA), weeks:   20.2    Date on which patient was at this GA:   10/18/2024    GA Calculation Method:   LMP    GA Date:   03/05/2025    Number of fetuses:   1    Reason for screen:   OTHER             screen    Donor egg?:   N   No follow-ups on file.   Future Appointments  Date Time Provider Department Center  11/05/2024  1:00 PM [facility] US  1 [facility]OB-IMG None    For next visit:  continue with routine prenatal care     Damien Parsley, CNM [facility] 10/24/252:57 PM

## 2024-10-18 ENCOUNTER — Encounter: Payer: Self-pay | Admitting: Certified Nurse Midwife

## 2024-10-18 ENCOUNTER — Ambulatory Visit (INDEPENDENT_AMBULATORY_CARE_PROVIDER_SITE_OTHER): Admitting: Certified Nurse Midwife

## 2024-10-18 VITALS — BP 122/75 | HR 88 | Wt 193.5 lb

## 2024-10-18 DIAGNOSIS — Z3A20 20 weeks gestation of pregnancy: Secondary | ICD-10-CM | POA: Diagnosis not present

## 2024-10-18 DIAGNOSIS — Z1379 Encounter for other screening for genetic and chromosomal anomalies: Secondary | ICD-10-CM

## 2024-10-18 DIAGNOSIS — Z3480 Encounter for supervision of other normal pregnancy, unspecified trimester: Secondary | ICD-10-CM

## 2024-10-18 DIAGNOSIS — O09522 Supervision of elderly multigravida, second trimester: Secondary | ICD-10-CM | POA: Diagnosis not present

## 2024-10-18 DIAGNOSIS — O09529 Supervision of elderly multigravida, unspecified trimester: Secondary | ICD-10-CM

## 2024-10-18 DIAGNOSIS — Z3402 Encounter for supervision of normal first pregnancy, second trimester: Secondary | ICD-10-CM

## 2024-10-18 NOTE — Assessment & Plan Note (Signed)
 Needs to return on 11/11 to complete cardiac views from anatomy US .  Reviewed red flag warning signs anticipatory guidance for upcoming prenatal care.

## 2024-10-18 NOTE — Assessment & Plan Note (Signed)
 AFP collected today   S/p flu vaccine   Husband received flu vaccine today   Discussed starting OTC Pepcid twice daily for GERD

## 2024-10-23 ENCOUNTER — Ambulatory Visit: Payer: Self-pay | Admitting: Certified Nurse Midwife

## 2024-10-23 LAB — AFP, SERUM, OPEN SPINA BIFIDA
AFP MoM: 1.19
AFP Value: 58.9 ng/mL
Gest. Age on Collection Date: 20.3 wk
Maternal Age At EDD: 42.8 a
OSBR Risk 1 IN: 6704
Test Results:: NEGATIVE
Weight: 193 [lb_av]

## 2024-10-29 ENCOUNTER — Encounter: Payer: Self-pay | Admitting: Licensed Practical Nurse

## 2024-11-01 ENCOUNTER — Other Ambulatory Visit: Payer: Self-pay

## 2024-11-01 DIAGNOSIS — Z362 Encounter for other antenatal screening follow-up: Secondary | ICD-10-CM

## 2024-11-01 NOTE — Progress Notes (Signed)
 Order placed for f/u anatomy for heart views per 10/14/24 Anatomy scan.

## 2024-11-05 ENCOUNTER — Ambulatory Visit

## 2024-11-05 DIAGNOSIS — Z3A22 22 weeks gestation of pregnancy: Secondary | ICD-10-CM | POA: Diagnosis not present

## 2024-11-05 DIAGNOSIS — Z362 Encounter for other antenatal screening follow-up: Secondary | ICD-10-CM | POA: Diagnosis not present

## 2024-11-12 ENCOUNTER — Ambulatory Visit: Payer: Self-pay | Admitting: Licensed Practical Nurse

## 2024-11-12 ENCOUNTER — Other Ambulatory Visit: Payer: Self-pay | Admitting: Licensed Practical Nurse

## 2024-11-12 DIAGNOSIS — O09513 Supervision of elderly primigravida, third trimester: Secondary | ICD-10-CM

## 2024-11-12 DIAGNOSIS — O0993 Supervision of high risk pregnancy, unspecified, third trimester: Secondary | ICD-10-CM

## 2024-11-12 NOTE — Progress Notes (Signed)
 Pt had repeat US , unable to see all heart views. Per Dr Ileana, Pt should be seen by MFM for detailed US .  Orders placed MFM to schedule pt Jinnie Cookey, CNM  [facility] 11/12/24  2:32 PM

## 2024-11-14 NOTE — Progress Notes (Signed)
    Return Prenatal Note   Subjective   43 y.o. G3P0020 at [redacted]w[redacted]d presents for this follow-up prenatal visit.  Patient is doing well, she has increased movement and no new concerns. Patient reports: Movement: Present Contractions: Not present  Objective   Flow sheet Vitals: Pulse Rate: 92 BP: 132/74 Fundal Height: 24 cm Fetal Heart Rate (bpm): 150 Total weight gain: 14 lb 11.2 oz (6.668 kg)  General Appearance  No acute distress, well appearing, and well nourished Pulmonary   Normal work of breathing Neurologic   Alert and oriented to person, place, and time Psychiatric   Mood and affect within normal limits   Assessment/Plan   Plan  43 y.o. G3P0020 at [redacted]w[redacted]d presents for follow-up OB visit. Reviewed prenatal record including previous visit note.  AMA (advanced maternal age) multigravida 35+ Growth scan ordered for 28 weeks. Also has US  ordered to complete heart views with both AOB and MFM if still needed.   Supervision of other normal pregnancy, antepartum Feeling well overall.  Reviewed 28 week labs for next visit. Reviewed red flag warning signs anticipatory guidance for upcoming prenatal care.        Orders Placed This Encounter  Procedures   US  OB Follow Up    Standing Status:   Future    Expected Date:   12/15/2024    Expiration Date:   11/15/2025    Reason for exam::   growth    Preferred imaging location?:   Internal   Tdap vaccine greater than or equal to 7yo IM    Standing Status:   Future    Expected Date:   12/13/2024    Expiration Date:   03/13/2025   28 Week RH+Panel    Standing Status:   Future    Expected Date:   12/13/2024    Expiration Date:   03/13/2025   No follow-ups on file.   Future Appointments  Date Time Provider Department Center  11/26/2024  3:00 PM [facility] US  1 AOB-IMG None  12/13/2024 10:20 AM AOB-OBGYN LAB [facility] None  12/13/2024 10:55 AM Lynda Bradley, CNM AOB-AOB None  01/06/2025  7:00 AM [facility] PROVIDER 1 [facility]   01/06/2025  7:30 AM [facility] US2 [facility]    For next visit:  ROB with 1 hour glucola, third trimester labs, and Tdap    Damien Parsley, CNM [facility]

## 2024-11-15 ENCOUNTER — Encounter: Payer: Self-pay | Admitting: Certified Nurse Midwife

## 2024-11-15 ENCOUNTER — Ambulatory Visit: Admitting: Certified Nurse Midwife

## 2024-11-15 VITALS — BP 132/74 | HR 92 | Wt 197.7 lb

## 2024-11-15 DIAGNOSIS — Z23 Encounter for immunization: Secondary | ICD-10-CM

## 2024-11-15 DIAGNOSIS — Z130 Encounter for screening for diseases of the blood and blood-forming organs and certain disorders involving the immune mechanism: Secondary | ICD-10-CM

## 2024-11-15 DIAGNOSIS — Z114 Encounter for screening for human immunodeficiency virus [HIV]: Secondary | ICD-10-CM

## 2024-11-15 DIAGNOSIS — Z3480 Encounter for supervision of other normal pregnancy, unspecified trimester: Secondary | ICD-10-CM

## 2024-11-15 DIAGNOSIS — Z131 Encounter for screening for diabetes mellitus: Secondary | ICD-10-CM

## 2024-11-15 DIAGNOSIS — Z3A24 24 weeks gestation of pregnancy: Secondary | ICD-10-CM | POA: Diagnosis not present

## 2024-11-15 DIAGNOSIS — O09522 Supervision of elderly multigravida, second trimester: Secondary | ICD-10-CM | POA: Diagnosis not present

## 2024-11-15 DIAGNOSIS — O09523 Supervision of elderly multigravida, third trimester: Secondary | ICD-10-CM

## 2024-11-15 NOTE — Assessment & Plan Note (Signed)
 Feeling well overall.  Reviewed 28 week labs for next visit. Reviewed red flag warning signs anticipatory guidance for upcoming prenatal care.

## 2024-11-15 NOTE — Assessment & Plan Note (Signed)
 Growth scan ordered for 28 weeks. Also has US  ordered to complete heart views with both AOB and MFM if still needed.

## 2024-11-20 ENCOUNTER — Ambulatory Visit

## 2024-11-26 ENCOUNTER — Ambulatory Visit

## 2024-11-26 DIAGNOSIS — O09523 Supervision of elderly multigravida, third trimester: Secondary | ICD-10-CM

## 2024-11-26 DIAGNOSIS — Z3480 Encounter for supervision of other normal pregnancy, unspecified trimester: Secondary | ICD-10-CM

## 2024-12-12 NOTE — Patient Instructions (Incomplete)
 Third Trimester of Pregnancy  The third trimester of pregnancy is from week 28 through week 40. This is months 7 through 9. The third trimester is a time when your baby is growing fast. Body changes during your third trimester Your body continues to change during this time. The changes usually go away after your baby is born. Physical changes You will continue to gain weight. You may get stretch marks on your hips, belly, and breasts. Your breasts will keep growing and may hurt. A yellow fluid (colostrum) may leak from your breasts. This is the first milk you're making for your baby. Your hair may grow faster and get thicker. In some cases, you may get hair loss. Your belly button may stick out. You may have more swelling in your hands, face, or ankles. Health changes You may have heartburn. You may feel short of breath. This is caused by the uterus that is now bigger. You may have more aches in the pelvis, back, or thighs. You may have more tingling or numbness in your hands, arms, and legs. You may pee more often. You may have trouble pooping (constipation) or swollen veins in the butt that can itch or get painful (hemorrhoids). Other changes You may have more problems sleeping. You may notice the baby moving lower in your belly (dropping). You may have more fluid coming from your vagina. Your joints may feel loose, and you may have pain around your pelvic bone. Follow these instructions at home: Medicines Take medicines only as told by your health care provider. Some medicines are not safe during pregnancy. Your provider may change the medicines that you take. Do not take any medicines unless told to by your provider. Take a prenatal vitamin that has at least 600 micrograms (mcg) of folic acid. Do not use herbal medicines, illegal drugs, or medicines that are not approved by your provider. Eating and drinking While you're pregnant your body needs additional nutrition to help  support your growing baby. Talk with your provider about your nutritional needs. Activity Most women are able to exercise regularly during pregnancy. Exercise routines may need to change at the end of your pregnancy. Talk to your provider about your activities and exercise routine. Relieving pain and discomfort Rest often with your legs raised if you have leg cramps or low back pain. Take warm sitz baths to soothe pain from hemorrhoids. Use hemorrhoid cream if your provider says it's okay. Wear a good, supportive bra if your breasts hurt. Do not use hot tubs, steam rooms, or saunas. Do not douche. Do not use tampons or scented pads. Safety Talk to your provider before traveling far distances. Wear your seatbelt at all times when you're in a car. Talk to your provider if someone hits you, hurts you, or yells at you. Preparing for birth To prepare for your baby: Take childbirth and breastfeeding classes. Visit the hospital and tour the maternity area. Buy a rear-facing car seat. Learn how to install it in your car. General instructions Avoid cat litter boxes and soil used by cats. These things carry germs that can cause harm to your pregnancy and your baby. Do not drink alcohol, smoke, vape, or use products with nicotine or tobacco in them. If you need help quitting, talk with your provider. Keep all follow-up visits for your third trimester. Your provider will do more exams and tests during this trimester. Write down your questions. Take them to your prenatal visits. Your provider also will: Talk with you about  your overall health. Give you advice or refer you to specialists who can help with different needs, including: Mental health and counseling. Foods and healthy eating. Ask for help if you need help with food. Where to find more information American Pregnancy Association: americanpregnancy.org [employer] of Obstetricians and Gynecologists: acog.org Office on [employer] Health:  [employer] Contact a health care provider if: You have a headache that does not go away when you take medicine. You have any of these problems: You can't eat or drink. You have nausea and vomiting. You have watery poop (diarrhea) for 2 days or more. You have pain when you pee, or your pee smells bad. You have been sick for 2 days or more and aren't getting better. Contact your provider right away if: You have any of these coming from your vagina: Abnormal discharge. Bad-smelling fluid. Bleeding. Your baby is moving less than usual. You have signs of labor: You have any contractions, belly cramping, or have pain in your pelvis or lower back before 37 weeks of pregnancy (preterm labor). You have regular contractions that are less than 5 minutes apart. Your water breaks. You have symptoms of high blood pressure or preeclampsia. These include: A severe, throbbing headache that does not go away. Sudden or extreme swelling of your face, hands, legs, or feet. Vision problems: You see spots. You have blurry vision. Your eyes are sensitive to light. If you can't reach your provider, go to an urgent care or emergency room. Get help right away if: You faint, become confused, or can't think clearly. You have chest pain or trouble breathing. You have any kind of injury, such as from a fall or a car crash. These symptoms may be an emergency. Call 911 right away. Do not wait to see if the symptoms will go away. Do not drive yourself to the hospital. This information is not intended to replace advice given to you by your health care provider. Make sure you discuss any questions you have with your health care provider. Document Revised: 09/14/2023 Document Reviewed: 04/14/2023 Elsevier Patient Education  2024 [employer].

## 2024-12-13 ENCOUNTER — Ambulatory Visit (INDEPENDENT_AMBULATORY_CARE_PROVIDER_SITE_OTHER): Admitting: Advanced Practice Midwife

## 2024-12-13 ENCOUNTER — Encounter: Payer: Self-pay | Admitting: Advanced Practice Midwife

## 2024-12-13 ENCOUNTER — Other Ambulatory Visit

## 2024-12-13 VITALS — BP 108/72 | HR 90 | Wt 209.6 lb

## 2024-12-13 DIAGNOSIS — O0993 Supervision of high risk pregnancy, unspecified, third trimester: Secondary | ICD-10-CM | POA: Diagnosis not present

## 2024-12-13 DIAGNOSIS — O09513 Supervision of elderly primigravida, third trimester: Secondary | ICD-10-CM

## 2024-12-13 DIAGNOSIS — Z114 Encounter for screening for human immunodeficiency virus [HIV]: Secondary | ICD-10-CM

## 2024-12-13 DIAGNOSIS — Z130 Encounter for screening for diseases of the blood and blood-forming organs and certain disorders involving the immune mechanism: Secondary | ICD-10-CM

## 2024-12-13 DIAGNOSIS — Z1332 Encounter for screening for maternal depression: Secondary | ICD-10-CM | POA: Diagnosis not present

## 2024-12-13 DIAGNOSIS — Z3A28 28 weeks gestation of pregnancy: Secondary | ICD-10-CM | POA: Diagnosis not present

## 2024-12-13 DIAGNOSIS — Z131 Encounter for screening for diabetes mellitus: Secondary | ICD-10-CM

## 2024-12-13 DIAGNOSIS — O09523 Supervision of elderly multigravida, third trimester: Secondary | ICD-10-CM | POA: Diagnosis not present

## 2024-12-13 DIAGNOSIS — Z3480 Encounter for supervision of other normal pregnancy, unspecified trimester: Secondary | ICD-10-CM

## 2024-12-13 NOTE — Progress Notes (Signed)
 "   Routine Prenatal Care Visit  Subjective  Janice Bradley is a 43 y.o. G3P0020 at [redacted]w[redacted]d being seen today for ongoing prenatal care.  She is currently monitored for the following issues for this high-risk pregnancy and has Supervision of high-risk pregnancy; AMA (advanced maternal age) multigravida 35+; and ASCUS of cervix with negative high risk HPV on their problem list.  ----------------------------------------------------------------------------------- Patient reports she is doing well and has been relatively comfortable this pregnancy. Had 28 week labs today. She will have growth ultrasound on 1/14 at this office (was scheduled for 1/12 in [place]).   Contractions: Not present. Vag. Bleeding: None.  Movement: Present. Leaking Fluid denies.  ----------------------------------------------------------------------------------- The following portions of the patient's history were reviewed and updated as appropriate: allergies, current medications, past family history, past medical history, past social history, past surgical history and problem list. Problem list updated.  Objective  BP 108/72   Pulse 90   Wt 209 lb 9.6 oz (95.1 kg)   LMP 05/29/2024 (Exact Date)   BMI 33.83 kg/m  Pregravid weight 183 lb (83 kg) Total Weight Gain 26 lb 9.6 oz (12.1 kg) Urinalysis: Urine Protein    Urine Glucose    Fetal Status: Fetal Heart Rate (bpm): 152 Fundal Height: 29 cm Movement: Present      General:  Alert, oriented and cooperative. Patient is in no acute distress.  Skin: Skin is warm and dry. No rash noted.   Cardiovascular: Normal heart rate noted  Respiratory: Normal respiratory effort, no problems with respiration noted  Abdomen: Soft, gravid, appropriate for gestational age. Pain/Pressure: Absent     Pelvic:  Cervical exam deferred        Extremities: Normal range of motion.  Edema: None  Mental Status: Normal mood and affect. Normal behavior. Normal judgment and thought content.     Edinburgh Postnatal Depression Scale - 42/19/25 1100       Edinburgh Postnatal Depression Scale:  In the Past 42 Days   I have been able to laugh and see the funny side of things. 0    I have looked forward with enjoyment to things. 0    I have blamed myself unnecessarily when things went wrong. 1    I have been anxious or worried for no good reason. 1    I have felt scared or panicky for no good reason. 1    Things have been getting on top of me. 0    I have been so unhappy that I have had difficulty sleeping. 0    I have felt sad or miserable. 0    I have been so unhappy that I have been crying. 0    The thought of harming myself has occurred to me. 0    Edinburgh Postnatal Depression Scale Total 3          Assessment   43 y.o. G3P0020 at [redacted]w[redacted]d by  03/05/2025, by Last Menstrual Period presenting for routine prenatal visit  Plan   THIRD Problems (from 07/26/24 to present)     Problem Noted Diagnosed Resolved   AMA (advanced maternal age) multigravida 35+ 08/22/2024 by Delinda Jinnie Jansky, CNM  No   Overview Signed 09/19/2024  8:53 AM by Charma Domino, CNM  Growth scans q 4 wks starting at 28 wks.  Weekly ANT 34 wks. IOL 39-40 wks.      Supervision of other normal pregnancy, antepartum 07/26/2024 by Loralyn Sander, CMA  No   Overview Addendum 12/13/2024 11:13 AM by Taft,  Crystal, LPN   Electrical Engineer Ob/Gyn Dating  03/05/2025, by Last Menstrual Period  Language  English Anatomy US   Normal; Anterior placenta  Flu Vaccine  10/02/2024 Genetic Screen  NIPS: Neg; Female  TDaP vaccine  03/14/24 Hgb A1C or  GTT Early : 5.3 Third trimester :   Covid X3- Moderna   LAB RESULTS   Rhogam  A/Positive/-- (08/28 1055)  Blood Type A/Positive/-- (08/28 1055)   RSV Offer Antibody Negative (08/28 1055)  Feeding Plan Breastfeed Rubella 2.29 (08/28 1055)  Contraception BC Pills RPR Non Reactive (08/28 1055)   Circumcision Yes HBsAg Negative (08/28 1055)    Pediatrician  Unsure 12/13/24 HIV Non Reactive (08/28 1055)  Support Person Husband Varicella Reactive (08/28 1055)  Prenatal Classes Yes GBS  (For PCN allergy, check sensitivities)     Hep C Non Reactive (08/28 1055)   BTL Consent NA Pap Diagnosis  Date Value Ref Range Status  08/22/2024 (A)  Final   - Atypical squamous cells of undetermined significance (ASC-US )    VBAC Consent NA Hgb Electro  Normal 08/22/24    CF      SMA               Class 1 obesity due to excess calories without serious comorbidity with body mass index (BMI) of 30.0 to 30.9 in adult 03/14/2024 by Vincente Shivers, NP  07/26/2024 by Loralyn Sander, CMA        Preterm labor symptoms and general obstetric precautions including but not limited to vaginal bleeding, contractions, leaking of fluid and fetal movement were reviewed in detail with the patient. Please refer to After Visit Summary for other counseling recommendations.   Return in about 2 weeks (around 12/27/2024) for rob.  Slater Rains, CNM 42/19/2025 12:03 PM    "

## 2024-12-14 LAB — 28 WEEK RH+PANEL
Basophils Absolute: 0.1 x10E3/uL (ref 0.0–0.2)
Basos: 1 %
EOS (ABSOLUTE): 0.1 x10E3/uL (ref 0.0–0.4)
Eos: 1 %
Gestational Diabetes Screen: 100 mg/dL (ref 70–139)
HIV Screen 4th Generation wRfx: NONREACTIVE
Hematocrit: 38.3 % (ref 34.0–46.6)
Hemoglobin: 12.7 g/dL (ref 11.1–15.9)
Immature Grans (Abs): 0.2 x10E3/uL — ABNORMAL HIGH (ref 0.0–0.1)
Immature Granulocytes: 2 %
Lymphocytes Absolute: 2 x10E3/uL (ref 0.7–3.1)
Lymphs: 19 %
MCH: 30.6 pg (ref 26.6–33.0)
MCHC: 33.2 g/dL (ref 31.5–35.7)
MCV: 92 fL (ref 79–97)
Monocytes Absolute: 0.9 x10E3/uL (ref 0.1–0.9)
Monocytes: 9 %
Neutrophils Absolute: 7.3 x10E3/uL — ABNORMAL HIGH (ref 1.4–7.0)
Neutrophils: 68 %
Platelets: 212 x10E3/uL (ref 150–450)
RBC: 4.15 x10E6/uL (ref 3.77–5.28)
RDW: 12.9 % (ref 11.7–15.4)
RPR Ser Ql: NONREACTIVE
WBC: 10.5 x10E3/uL (ref 3.4–10.8)

## 2024-12-30 NOTE — Progress Notes (Signed)
" ° ° °  Return Prenatal Note   Subjective   44 y.o. G3P0020 at [redacted]w[redacted]d presents for this follow-up prenatal visit.  Patient generally is feeling well. No c/o. Just happy to be pregnant. Signed up for CBE at [facility] that is via telehealth.  Patient reports: Movement: Present Contractions: Not present  Objective   Flow sheet Vitals: Pulse Rate: 97 BP: 134/79 Fundal Height: 33 cm Total weight gain: 29 lb (13.2 kg)  General Appearance  No acute distress, well appearing, and well nourished Pulmonary   Normal work of breathing Neurologic   Alert and oriented to person, place, and time Psychiatric   Mood and affect within normal limits   Assessment/Plan   Plan  44 y.o. G3P0020 at [redacted]w[redacted]d presents for follow-up OB visit. Reviewed prenatal record including previous visit note.  AMA (advanced maternal age) multigravida 35+ Growth scans q 4 wks starting at 28 wks. Next scheduled on 1/14. Weekly ANT 34 wks. IOL 39-40 wks.      No orders of the defined types were placed in this encounter.  No follow-ups on file.   Future Appointments  Date Time Provider Department Center  01/08/2025  7:45 AM [facility] PROVIDER 1 [facility] [place]  01/08/2025  8:00 AM [facility] US  1 [facility] [place]  01/09/2025  8:15 AM Slaughterbeck, Damien, CNM [facility] None    For next visit:  continue with routine prenatal care     Lauraine Lakes, CNM  12/31/2024 11:54 AM  "

## 2024-12-31 ENCOUNTER — Ambulatory Visit: Admitting: Registered Nurse

## 2024-12-31 VITALS — BP 134/79 | HR 97 | Wt 212.0 lb

## 2024-12-31 DIAGNOSIS — Z3A30 30 weeks gestation of pregnancy: Secondary | ICD-10-CM | POA: Diagnosis not present

## 2024-12-31 DIAGNOSIS — O09523 Supervision of elderly multigravida, third trimester: Secondary | ICD-10-CM

## 2024-12-31 DIAGNOSIS — O0993 Supervision of high risk pregnancy, unspecified, third trimester: Secondary | ICD-10-CM

## 2024-12-31 NOTE — Assessment & Plan Note (Signed)
 Growth scans q 4 wks starting at 28 wks. Next scheduled on 1/14. Weekly ANT 34 wks. IOL 39-40 wks.

## 2025-01-06 ENCOUNTER — Ambulatory Visit

## 2025-01-06 ENCOUNTER — Other Ambulatory Visit

## 2025-01-06 NOTE — Progress Notes (Unsigned)
" ° ° °  Return Prenatal Note   Subjective   44 y.o. G3P0020 at [redacted]w[redacted]d presents for this follow-up prenatal visit.  Patient is doing well. She has been having some carpal tunnel in both wrist. She reports good fetal movement. She denies urinary symptoms. Patient reports: Movement: Present Contractions: Not present  Objective   Flow sheet Vitals: Pulse Rate: 94 BP: (!) 149/75 Total weight gain: 31 lb 4.8 oz (14.2 kg)  General Appearance  No acute distress, well appearing, and well nourished Pulmonary   Normal work of breathing Neurologic   Alert and oriented to person, place, and time Psychiatric   Mood and affect within normal limits   Assessment/Plan   Plan  44 y.o. G3P0020 at [redacted]w[redacted]d presents for follow-up OB visit. Reviewed prenatal record including previous visit note.  No problem-specific Assessment & Plan notes found for this encounter.      No orders of the defined types were placed in this encounter.  No follow-ups on file.   Future Appointments  Date Time Provider Department Center  01/29/2025  2:30 PM [facility] US  1 [facility] [place]  02/05/2025  4:00 PM [facility] US  1 [facility] [place]    For next visit:  ROB with GBS screening      Damien Parsley, CNM [facility] 01/15/268:22 AM "

## 2025-01-08 ENCOUNTER — Other Ambulatory Visit

## 2025-01-08 ENCOUNTER — Other Ambulatory Visit: Payer: Self-pay

## 2025-01-08 ENCOUNTER — Ambulatory Visit: Admitting: Maternal & Fetal Medicine

## 2025-01-08 VITALS — BP 154/83 | HR 101

## 2025-01-08 DIAGNOSIS — O358XX0 Maternal care for other (suspected) fetal abnormality and damage, not applicable or unspecified: Secondary | ICD-10-CM

## 2025-01-08 DIAGNOSIS — O09523 Supervision of elderly multigravida, third trimester: Secondary | ICD-10-CM

## 2025-01-08 DIAGNOSIS — O09513 Supervision of elderly primigravida, third trimester: Secondary | ICD-10-CM

## 2025-01-08 DIAGNOSIS — O0993 Supervision of high risk pregnancy, unspecified, third trimester: Secondary | ICD-10-CM

## 2025-01-08 NOTE — Progress Notes (Signed)
 MFM Consultation  Janice Bradley is a 44 yo G3P0 who is seen today at 74 w 0d with an EDD of 03/05/25.      01/08/2025    8:00 AM 12/31/2024   11:24 AM 12/13/2024   10:58 AM  Vitals with BMI  Weight  212 lbs 209 lbs 10 oz  Systolic 154 134 891  Diastolic 83 79 72  Pulse 101 97 90    Single intrauterine pregnancy here for a detailed anatomy given incomplete anatomy. Normal anatomy with measurements consistent with dates There is good fetal movement and amniotic fluid volume Suboptimal views of the fetal anatomy were obtained secondary to fetal position and advanced gestational age. 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  Impression/Counseling  Umbilical vein varix is a dilation of the intraabdominal portion of the umbilical vein between the fetal abdominal wall and the inferior aspect of the liver, of uncertain clinical significance. Umbilical vein varix is seen in approximately 12/998 pregnancies.   Thought to result from an intrinsic weakness in the wall of the umbilical vein. Any fetal condition that increases umbilical venous pressure can dilate the extrahepatic portion of the vein where it has the least supporting structure. Normal umbilical vein diameter ranges from 2-4 mm at 15 weeks' gestation to 7-8 mm near term. No standard criteria exist for defining umbilical vein varix, but suggested criteria include: umbilical vein diameter, >9 mm, umbilical vein diameter, >2 SDs above the mean for gestational age  and umbilical vein diameter greater than the intrahepatic portion of the vein by at least 50%.    In approximately 70% of cases, umbilical vein varix is an isolated sonographic finding. However, aneuploidy is present in 5%-10% of all cases of umbilical vein varix. Perinatal mortality is as high as 44% in fetuses with umbilical vein varix when there is concomitant aneuploidy or other associated abnormalities; for isolated cases, perinatal mortality is approximately 5%. Long-term prognosis  depends on other, associated abnormalities. There are no abnormalities noted.   At this time we recommend a fetal echocardiogram (Referral sent to [facility]) and serial ultrasound surveillance.    Additionally, we recommend fetal nonstress or biophysical profile testing (or both),  weekly, beginning at 32-34 weeks.  Delivery recommended ~ 39 weeks  Lastly, Janice Bradley had an elevated blood pressure of 154/83 which did not repeat before she left. She was asymptomatic at the time. I attempted to call her but her phone nor her emergency contact did not allow voice messages.  She sees her provider [facility] tomorrow. I have messaged her about today's BP and ask for repeat and labs if it remained elevated.    I spent 30 minutes with > 50% in face to face consultation  All questions answered  Nathanel DOROTHA Fetters, MD.

## 2025-01-09 ENCOUNTER — Ambulatory Visit: Admitting: Certified Nurse Midwife

## 2025-01-09 ENCOUNTER — Encounter: Payer: Self-pay | Admitting: Certified Nurse Midwife

## 2025-01-09 VITALS — BP 149/75 | HR 94 | Wt 214.3 lb

## 2025-01-09 DIAGNOSIS — O0993 Supervision of high risk pregnancy, unspecified, third trimester: Secondary | ICD-10-CM

## 2025-01-09 DIAGNOSIS — O358XX0 Maternal care for other (suspected) fetal abnormality and damage, not applicable or unspecified: Secondary | ICD-10-CM | POA: Insufficient documentation

## 2025-01-09 DIAGNOSIS — O133 Gestational [pregnancy-induced] hypertension without significant proteinuria, third trimester: Secondary | ICD-10-CM | POA: Diagnosis not present

## 2025-01-09 DIAGNOSIS — Z3A32 32 weeks gestation of pregnancy: Secondary | ICD-10-CM

## 2025-01-09 DIAGNOSIS — O358XX1 Maternal care for other (suspected) fetal abnormality and damage, fetus 1: Secondary | ICD-10-CM

## 2025-01-09 DIAGNOSIS — R030 Elevated blood-pressure reading, without diagnosis of hypertension: Secondary | ICD-10-CM

## 2025-01-09 DIAGNOSIS — O139 Gestational [pregnancy-induced] hypertension without significant proteinuria, unspecified trimester: Secondary | ICD-10-CM | POA: Insufficient documentation

## 2025-01-09 NOTE — Progress Notes (Signed)
" ° ° °  Return Prenatal Note   Subjective   44 y.o. G3P0020 at [redacted]w[redacted]d presents for this follow-up prenatal visit.  Patient is doing well. She has been having some carpal tunnel in both wrist. She reports good fetal movement. She denies urinary symptoms. Patient reports: Movement: Present Contractions: Not present  Objective   Flow sheet Vitals: Pulse Rate: 94 BP: (!) 149/75 Fundal Height: 32 cm Fetal Heart Rate (bpm): 140 Total weight gain: 31 lb 4.8 oz (14.2 kg)  General Appearance  No acute distress, well appearing, and well nourished Pulmonary   Normal work of breathing Neurologic   Alert and oriented to person, place, and time Psychiatric   Mood and affect within normal limits   Assessment/Plan   Plan  43 y.o. G3P0020 at [redacted]w[redacted]d presents for follow-up OB visit. Reviewed prenatal record including previous visit note.  Gestational hypertension Discussed GHTN diagnosis today, reviewed care plan and labs to rule out PreE today. Reviewed recommendation of induction at 37 weeks.  Supervision of high-risk pregnancy Discussed Carpal Tunnel symptoms, recommended use of wrist brace at night to help with discomfort  Umbilical vein abnormality affecting pregnancy Will continue weekly NST and ultrasound surveillance, reviewed ultrasound with MFM for appropriate growth. Will schedule weekly NSTs      Orders Placed This Encounter  Procedures   Comprehensive metabolic panel with GFR   CBC   Protein / creatinine ratio, urine   No follow-ups on file.   Future Appointments  Date Time Provider Department Center  01/16/2025  8:15 AM [facility] ROOM [facility] None  01/24/2025  8:15 AM AOB-[facility] ROOM A[facility]-A[facility] None  01/24/2025  8:55 AM Dominic, Jinnie Jansky, CNM [facility] None  01/29/2025  2:30 PM [facility] US  1 [facility] [place]  02/05/2025  4:00 PM [facility] US  1 [facility] [place]    For next visit:  ROB with GBS screening      Damien Parsley, CNM  [facility] of   01/09/2609:38 AM "

## 2025-01-09 NOTE — Assessment & Plan Note (Addendum)
 Discussed GHTN diagnosis today, reviewed care plan and labs to rule out PreE today. Reviewed recommendation of induction at 37 weeks.

## 2025-01-09 NOTE — Assessment & Plan Note (Signed)
 Discussed Carpal Tunnel symptoms, recommended use of wrist brace at night to help with discomfort

## 2025-01-09 NOTE — Assessment & Plan Note (Addendum)
 Will continue weekly NST and ultrasound surveillance, reviewed ultrasound with MFM for appropriate growth. Will schedule weekly NSTs

## 2025-01-10 LAB — COMPREHENSIVE METABOLIC PANEL WITH GFR
ALT: 8 IU/L (ref 0–32)
AST: 11 IU/L (ref 0–40)
Albumin: 3.4 g/dL — ABNORMAL LOW (ref 3.9–4.9)
Alkaline Phosphatase: 144 IU/L — ABNORMAL HIGH (ref 41–116)
BUN/Creatinine Ratio: 17 (ref 9–23)
BUN: 9 mg/dL (ref 6–24)
Bilirubin Total: 0.3 mg/dL (ref 0.0–1.2)
CO2: 19 mmol/L — ABNORMAL LOW (ref 20–29)
Calcium: 9.7 mg/dL (ref 8.7–10.2)
Chloride: 105 mmol/L (ref 96–106)
Creatinine, Ser: 0.53 mg/dL — ABNORMAL LOW (ref 0.57–1.00)
Globulin, Total: 2.6 g/dL (ref 1.5–4.5)
Glucose: 87 mg/dL (ref 70–99)
Potassium: 4 mmol/L (ref 3.5–5.2)
Sodium: 139 mmol/L (ref 134–144)
Total Protein: 6 g/dL (ref 6.0–8.5)
eGFR: 118 mL/min/1.73

## 2025-01-10 LAB — CBC
Hematocrit: 38.7 % (ref 34.0–46.6)
Hemoglobin: 13.2 g/dL (ref 11.1–15.9)
MCH: 31.7 pg (ref 26.6–33.0)
MCHC: 34.1 g/dL (ref 31.5–35.7)
MCV: 93 fL (ref 79–97)
Platelets: 191 x10E3/uL (ref 150–450)
RBC: 4.16 x10E6/uL (ref 3.77–5.28)
RDW: 12.9 % (ref 11.7–15.4)
WBC: 9.1 x10E3/uL (ref 3.4–10.8)

## 2025-01-11 ENCOUNTER — Ambulatory Visit: Payer: Self-pay | Admitting: Certified Nurse Midwife

## 2025-01-11 LAB — PROTEIN / CREATININE RATIO, URINE
Creatinine, Urine: 76.1 mg/dL
Protein, Ur: 10.6 mg/dL
Protein/Creat Ratio: 139 mg/g{creat} (ref 0–200)

## 2025-01-13 ENCOUNTER — Encounter: Payer: Self-pay | Admitting: Licensed Practical Nurse

## 2025-01-16 ENCOUNTER — Ambulatory Visit

## 2025-01-16 VITALS — BP 140/79 | HR 103 | Wt 215.4 lb

## 2025-01-16 DIAGNOSIS — O09523 Supervision of elderly multigravida, third trimester: Secondary | ICD-10-CM

## 2025-01-16 DIAGNOSIS — O133 Gestational [pregnancy-induced] hypertension without significant proteinuria, third trimester: Secondary | ICD-10-CM

## 2025-01-16 NOTE — Progress Notes (Signed)
" ° ° °  NURSE VISIT NOTE  Subjective:    Patient ID: Kattie Santoyo, female    DOB: 19-Aug-1981, 44 y.o.   MRN: [id number]  HPI  Patient is a 44 y.o. G53P0020 female who presents for fetal monitoring per order from Eleanor Canny, CNM.   Objective:    BP (!) 140/79   Pulse (!) 103   Wt 215 lb 6.4 oz (97.7 kg)   LMP 05/29/2024 (Exact Date)   BMI 34.77 kg/m  Estimated Date of Delivery: 03/05/25  [redacted]w[redacted]d  Fetus A Non-Stress Test Interpretation for 01/16/25  Indication: Advanced Maternal Age >40 years and Chronic Hypertenstion  Fetal Heart Rate A Mode: Doppler Variability: Moderate Accelerations: 15 x 15 Decelerations: None Multiple birth?: No  Uterine Activity Mode: Toco Resting Tone Palpated: Relaxed Resting Time: Adequate  Interpretation (Fetal Testing) Nonstress Test Interpretation: Reactive Overall Impression: Reassuring for gestational age   Assessment:   1. Multigravida of advanced maternal age in third trimester   2. Gestational hypertension, third trimester      Plan:   Results reviewed by Lauraine Lakes, CNM and discussed with patient.     Camelia Fetters, CMA [facility]

## 2025-01-16 NOTE — Patient Instructions (Signed)
 Nonstress Test: What to Expect A nonstress test, also called an NST, is done during pregnancy to check your baby's heartbeat. The procedure can help to show if your baby is healthy. It may be done if: Your due date has passed. Your pregnancy is high risk. Your baby is moving less than normal. You've lost a previous pregnancy. Your baby is growing slowly. There's too much or too little fluid around your baby. The NST may be done in the third trimester to find out if it's best for your baby to be born early. During an NST, your baby's heartbeat is watched for at least 20 minutes. If the baby is healthy, the heart rate will go up when the baby moves and will return to normal when the baby rests. This should happen at least twice during the test. Tell a health care provider about: Any allergies you have. Any medical problems you have. All medicines you take. These include vitamins, herbs, eye drops, and creams. Any surgeries you've had. Any past pregnancies you've had. What are the risks? There are no risks to you or your baby from a nonstress test. This procedure shouldn't be painful or uncomfortable. What happens before? Eat a meal right before the test or as told by your health care team. Food may help the baby to move. Use the restroom right before the test. What happens during a nonstress test?  Two monitors will be placed on your belly. One will check your baby's heart rate, and the other will check for contractions. You may be asked to lie down on your side or to sit up. You may be given a button to press when you feel your baby move. If your baby seems to be sleeping, you may be asked to drink some juice or soda, eat a snack, or change positions. These steps may vary. Ask what you can expect. What can I expect after? Your team will talk with you about the results and tell you the next steps. If your team gave you any diet or activity instructions, make sure to follow them. Keep all  follow-up visits. This is important to check on your health and the health of your baby. This information is not intended to replace advice given to you by your health care provider. Make sure you discuss any questions you have with your health care provider. Document Revised: 12/07/2023 Document Reviewed: 12/07/2023 Elsevier Patient Education  2025 [employer].

## 2025-01-23 NOTE — Progress Notes (Signed)
" ° ° °  Return Prenatal Note   Subjective   44 y.o. G3P0020 at [redacted]w[redacted]d presents for this follow-up prenatal visit.  Patient  Patient reports: Doing well. Getting excited to meet her baby. Does not intend to write a birth plan, feels confident in what may be recommended for her given she has a high risk pregnancy.  Movement: Present Contractions: Not present  Objective   Flow sheet Vitals: Pulse Rate: 94 BP: 132/83 Total weight gain: 36 lb 12.8 oz (16.7 kg)  Janice Bradley 07/03/1981 44y.o.  Fetus A Non-Stress Test Interpretation for 01/24/25  Indication: Advanced Maternal Age >40 years and GHTN abnormal umbilical cord   Fetal Heart Rate A Mode: External Baseline Rate (A): 130 bpm Variability: Moderate Accelerations: 15 x 15  Uterine Activity Mode: Toco Contraction Frequency (min): none  Interpretation (Fetal Testing) Nonstress Test Interpretation: Reactive    General Appearance  No acute distress, well appearing, and well nourished Pulmonary   Normal work of breathing Neurologic   Alert and oriented to person, place, and time Psychiatric   Mood and affect within normal limits   Assessment/Plan   Plan  44 y.o. G3P0020 at [redacted]w[redacted]d presents for follow-up OB visit. Reviewed prenatal record including previous visit note.  Supervision of high-risk pregnancy -finished CBE with Janice Bradley, planning epidural, her husband and mother as labor support, her husband has aweak stomach when is comes to bodily fluids,  -Plans to take infant CPR with her mother  -[facility] labs collected  -Reviewed labor warning signs and expectations for birth. Instructed to call office or come to hospital with persistent headache, vision changes, regular contractions, leaking of fluid, decreased fetal movement or vaginal bleeding.   Gestational hypertension -Normotensive today, has changed her diet to included less salt -labs collected -RNST  -Has BPP and US  scheduled out  -schedule IOL at next  visit       Orders Placed This Encounter  Procedures   CBC w/Diff/Platelet   Comprehensive metabolic panel with GFR   Protein / creatinine ratio, urine   Return in about 1 week (around 01/31/2025) for ROB.   Future Appointments  Date Time Provider Department Center  01/29/2025  2:30 PM [facility] US  1 [facility] [place]  02/05/2025  4:00 PM [facility] [place] US  1 [facility] [place]    For next visit:  continue with routine prenatal care     Janice Bradley M Janice Bradley, CNM  01/30/269:35 AM  "

## 2025-01-24 ENCOUNTER — Other Ambulatory Visit

## 2025-01-24 ENCOUNTER — Ambulatory Visit (INDEPENDENT_AMBULATORY_CARE_PROVIDER_SITE_OTHER): Admitting: Licensed Practical Nurse

## 2025-01-24 ENCOUNTER — Encounter: Payer: Self-pay | Admitting: Licensed Practical Nurse

## 2025-01-24 VITALS — BP 132/83 | HR 94 | Wt 219.8 lb

## 2025-01-24 DIAGNOSIS — O133 Gestational [pregnancy-induced] hypertension without significant proteinuria, third trimester: Secondary | ICD-10-CM

## 2025-01-24 DIAGNOSIS — Z3A34 34 weeks gestation of pregnancy: Secondary | ICD-10-CM | POA: Diagnosis not present

## 2025-01-24 DIAGNOSIS — O09523 Supervision of elderly multigravida, third trimester: Secondary | ICD-10-CM

## 2025-01-24 NOTE — Assessment & Plan Note (Addendum)
-  finished CBE with Jolynn Pack, planning epidural, her husband and mother as labor support, her husband has aweak stomach when is comes to bodily fluids,  -Plans to take infant CPR with her mother  -[facility] labs collected  -Reviewed labor warning signs and expectations for birth. Instructed to call office or come to hospital with persistent headache, vision changes, regular contractions, leaking of fluid, decreased fetal movement or vaginal bleeding.

## 2025-01-24 NOTE — Assessment & Plan Note (Signed)
-  Normotensive today, has changed her diet to included less salt -labs collected -RNST  -Has BPP and US  scheduled out  -schedule IOL at next visit

## 2025-01-25 LAB — CBC WITH DIFFERENTIAL/PLATELET
Basophils Absolute: 0 10*3/uL (ref 0.0–0.2)
Basos: 0 %
EOS (ABSOLUTE): 0 10*3/uL (ref 0.0–0.4)
Eos: 0 %
Hematocrit: 39.7 % (ref 34.0–46.6)
Hemoglobin: 13.1 g/dL (ref 11.1–15.9)
Immature Grans (Abs): 0.1 10*3/uL (ref 0.0–0.1)
Immature Granulocytes: 2 %
Lymphocytes Absolute: 1.8 10*3/uL (ref 0.7–3.1)
Lymphs: 21 %
MCH: 30.8 pg (ref 26.6–33.0)
MCHC: 33 g/dL (ref 31.5–35.7)
MCV: 93 fL (ref 79–97)
Monocytes Absolute: 0.8 10*3/uL (ref 0.1–0.9)
Monocytes: 9 %
Neutrophils Absolute: 6.1 10*3/uL (ref 1.4–7.0)
Neutrophils: 68 %
Platelets: 186 10*3/uL (ref 150–450)
RBC: 4.25 x10E6/uL (ref 3.77–5.28)
RDW: 13.1 % (ref 11.7–15.4)
WBC: 8.9 10*3/uL (ref 3.4–10.8)

## 2025-01-25 LAB — COMPREHENSIVE METABOLIC PANEL WITH GFR
ALT: 10 [IU]/L (ref 0–32)
AST: 15 [IU]/L (ref 0–40)
Albumin: 3.5 g/dL — ABNORMAL LOW (ref 3.9–4.9)
Alkaline Phosphatase: 179 [IU]/L — ABNORMAL HIGH (ref 41–116)
BUN/Creatinine Ratio: 19 (ref 9–23)
BUN: 9 mg/dL (ref 6–24)
Bilirubin Total: 0.2 mg/dL (ref 0.0–1.2)
CO2: 18 mmol/L — ABNORMAL LOW (ref 20–29)
Calcium: 9.2 mg/dL (ref 8.7–10.2)
Chloride: 103 mmol/L (ref 96–106)
Creatinine, Ser: 0.48 mg/dL — ABNORMAL LOW (ref 0.57–1.00)
Globulin, Total: 2.5 g/dL (ref 1.5–4.5)
Glucose: 78 mg/dL (ref 70–99)
Potassium: 4.3 mmol/L (ref 3.5–5.2)
Sodium: 136 mmol/L (ref 134–144)
Total Protein: 6 g/dL (ref 6.0–8.5)
eGFR: 121 mL/min/{1.73_m2}

## 2025-01-25 LAB — PROTEIN / CREATININE RATIO, URINE
Creatinine, Urine: 90.5 mg/dL
Protein, Ur: 19 mg/dL
Protein/Creat Ratio: 210 mg/g{creat} — ABNORMAL HIGH (ref 0–200)

## 2025-01-29 ENCOUNTER — Ambulatory Visit

## 2025-01-29 ENCOUNTER — Other Ambulatory Visit: Payer: Self-pay

## 2025-01-29 ENCOUNTER — Other Ambulatory Visit

## 2025-01-29 VITALS — BP 131/80 | HR 99

## 2025-01-29 DIAGNOSIS — O0993 Supervision of high risk pregnancy, unspecified, third trimester: Secondary | ICD-10-CM

## 2025-01-29 DIAGNOSIS — O358XX0 Maternal care for other (suspected) fetal abnormality and damage, not applicable or unspecified: Secondary | ICD-10-CM

## 2025-01-29 DIAGNOSIS — O09523 Supervision of elderly multigravida, third trimester: Secondary | ICD-10-CM

## 2025-02-05 ENCOUNTER — Other Ambulatory Visit

## 2025-02-07 ENCOUNTER — Encounter: Admitting: Certified Nurse Midwife

## 2025-03-05 ENCOUNTER — Inpatient Hospital Stay: Admit: 2025-03-05 | Payer: Self-pay
